# Patient Record
Sex: Female | Born: 1985 | Race: Black or African American | Hispanic: No | Marital: Single | Smoking: Former smoker
Health system: Southern US, Community
[De-identification: ages and names within clinical notes are randomized; demographics above are authoritative.]

## PROBLEM LIST (undated history)

## (undated) DIAGNOSIS — Z789 Other specified health status: Secondary | ICD-10-CM

## (undated) DIAGNOSIS — Z3490 Encounter for supervision of normal pregnancy, unspecified, unspecified trimester: Secondary | ICD-10-CM

## (undated) HISTORY — PX: OTHER SURGICAL HISTORY: SHX169

## (undated) HISTORY — PX: DILATION AND CURETTAGE OF UTERUS: SHX78

---

## 2006-01-09 ENCOUNTER — Emergency Department (HOSPITAL_COMMUNITY): Admission: EM | Admit: 2006-01-09 | Discharge: 2006-01-09 | Payer: Self-pay | Admitting: Family Medicine

## 2007-04-14 ENCOUNTER — Emergency Department (HOSPITAL_COMMUNITY): Admission: EM | Admit: 2007-04-14 | Discharge: 2007-04-14 | Payer: Self-pay | Admitting: Family Medicine

## 2008-02-15 ENCOUNTER — Inpatient Hospital Stay (HOSPITAL_COMMUNITY): Admission: RE | Admit: 2008-02-15 | Discharge: 2008-02-17 | Payer: Self-pay | Admitting: Obstetrics and Gynecology

## 2009-06-02 ENCOUNTER — Emergency Department (HOSPITAL_COMMUNITY): Admission: EM | Admit: 2009-06-02 | Discharge: 2009-06-02 | Payer: Self-pay | Admitting: Emergency Medicine

## 2010-01-27 ENCOUNTER — Emergency Department (HOSPITAL_COMMUNITY): Admission: EM | Admit: 2010-01-27 | Discharge: 2010-01-27 | Payer: Self-pay | Admitting: Emergency Medicine

## 2010-03-28 ENCOUNTER — Emergency Department (HOSPITAL_COMMUNITY): Admission: EM | Admit: 2010-03-28 | Discharge: 2010-03-28 | Payer: Self-pay | Admitting: Emergency Medicine

## 2010-04-01 ENCOUNTER — Emergency Department (HOSPITAL_COMMUNITY): Admission: EM | Admit: 2010-04-01 | Discharge: 2010-04-01 | Payer: Self-pay | Admitting: Family Medicine

## 2010-12-30 ENCOUNTER — Inpatient Hospital Stay (INDEPENDENT_AMBULATORY_CARE_PROVIDER_SITE_OTHER)
Admission: RE | Admit: 2010-12-30 | Discharge: 2010-12-30 | Disposition: A | Discharge status: Home / Self Care | Payer: Self-pay | Source: Ambulatory Visit | Attending: Family Medicine | Admitting: Family Medicine

## 2010-12-30 DIAGNOSIS — L0501 Pilonidal cyst with abscess: Secondary | ICD-10-CM

## 2011-01-03 LAB — CULTURE, ROUTINE-ABSCESS: Gram Stain: NONE SEEN

## 2011-01-20 LAB — POCT URINALYSIS DIP (DEVICE)
Bilirubin Urine: NEGATIVE
Glucose, UA: NEGATIVE mg/dL
Hgb urine dipstick: NEGATIVE
Ketones, ur: NEGATIVE mg/dL
Nitrite: POSITIVE — AB
Protein, ur: NEGATIVE mg/dL
Specific Gravity, Urine: 1.025 (ref 1.005–1.030)
Urobilinogen, UA: 1 mg/dL (ref 0.0–1.0)
pH: 5.5 (ref 5.0–8.0)

## 2011-01-20 LAB — CULTURE, ROUTINE-ABSCESS

## 2011-01-20 LAB — GC/CHLAMYDIA PROBE AMP, GENITAL
Chlamydia, DNA Probe: NEGATIVE
GC Probe Amp, Genital: NEGATIVE

## 2011-01-20 LAB — WET PREP, GENITAL
Trich, Wet Prep: NONE SEEN
Yeast Wet Prep HPF POC: NONE SEEN

## 2011-01-20 LAB — POCT PREGNANCY, URINE: Preg Test, Ur: NEGATIVE

## 2011-01-20 LAB — URINE CULTURE: Colony Count: >=100000

## 2011-02-09 LAB — CULTURE, ROUTINE-ABSCESS

## 2011-03-18 NOTE — Discharge Summary (Signed)
NAME:  Anna [name], Anna [name]             ACCOUNT NO.:  [id number]   MEDICAL RECORD NO.:  [id number]          PATIENT TYPE:  INP   LOCATION:  9145                          FACILITY:  [facility]   PHYSICIAN:  Zenaida Niece, M.D.DATE OF BIRTH:  October 17, 1986   DATE OF ADMISSION:  02/15/2008  DATE OF DISCHARGE:  02/17/2008                               DISCHARGE SUMMARY   ADMISSION DIAGNOSIS:  Intrauterine intrauterine pregnancy at 40 weeks.   DISCHARGE DIAGNOSIS:  Intrauterine intrauterine pregnancy at 40 weeks.   PROCEDURES:  On 02/15/2008, she had a spontaneous vaginal delivery.   HISTORY AND PHYSICAL:  This is a 25 year old black female gravida 4,  para 0-0-3-0 with an EGA of 40 plus weeks who presents for elective  induction.  Prenatal care complicated by recurrent UTIs treated several  times with last culture on February 01, 2008, being negative.  She also  tested positive for Trichomonas and was treated with Flagyl.   PRENATAL LABS:  Blood type is A positive with a negative antibody  screen.  Hemoglobin electrophoresis is normal.  RPR nonreactive, rubella  immune, hepatitis B surface antigen negative, HIV negative, gonorrhea  and Chlamydia negative, group B strep is negative, 1-hour Glucola 128,  and first trimester screen is normal.   PAST OB HISTORY:  Three elective terminations.   GYN HISTORY:  History of Trichomonas.  Remainder of her history is  noncontributory.   PHYSICAL EXAM:  She is afebrile with stable vital signs.  Fetal heart  tracing reactive.  Abdomen is gravid, nontender.  Cervix is 2 plus, 75,  minus 2, and membranes were ruptured revealing clear fluid.   HOSPITAL COURSE:  The patient was admitted by Dr. Senaida Ores and put on  Pitocin.  She then examined her and ruptured membranes.  She became  uncomfortable and received an epidural.  She progressed to complete,  pushed well, and on the evening of February 15, 2008, had a vaginal  delivery of a viable female infant with  Apgars of 8 at nine that weighed  7 pounds 4 ounces.  Placenta delivered spontaneous.  Perineum was intact  and estimated blood loss was 400 mL.  Postpartum, she had no significant  complications.  Pre-delivery hemoglobin was 10.9 and it was not checked  after delivery.  On postpartum day #2,  she was felt to be stable enough  for discharge to home.   DISCHARGE INSTRUCTIONS:  Regular diet, pelvic rest.  Follow up with [place]  in 6 weeks.   MEDICATIONS:  Over-the-counter ibuprofen as needed and she is given our  discharge pamphlet.     Zenaida Niece, M.D.  Electronically Signed    TDM/MEDQ  D:  02/17/2008  T:  02/17/2008  Job:  [id number]

## 2011-05-19 ENCOUNTER — Inpatient Hospital Stay (HOSPITAL_COMMUNITY)
Admission: AD | Admit: 2011-05-19 | Discharge: 2011-05-20 | Disposition: A | Discharge status: Home / Self Care | Payer: Self-pay | Source: Ambulatory Visit | Attending: Obstetrics and Gynecology | Admitting: Obstetrics and Gynecology

## 2011-05-19 DIAGNOSIS — R109 Unspecified abdominal pain: Secondary | ICD-10-CM | POA: Insufficient documentation

## 2011-05-19 DIAGNOSIS — N926 Irregular menstruation, unspecified: Secondary | ICD-10-CM

## 2011-05-19 HISTORY — DX: Other specified health status: Z78.9

## 2011-05-19 LAB — POCT PREGNANCY, URINE: Preg Test, Ur: NEGATIVE

## 2011-05-19 NOTE — Progress Notes (Signed)
Pt reports LMP was 06/02 and she "already knew I was pregnant". States she has not had a preg test. Tonight started having cramping and bleeding. Bleeding now is just on the tissue when she wipes.

## 2011-05-20 ENCOUNTER — Encounter (HOSPITAL_COMMUNITY): Payer: Self-pay | Admitting: *Deleted

## 2011-05-20 LAB — URINALYSIS, ROUTINE W REFLEX MICROSCOPIC
Bilirubin Urine: NEGATIVE
Glucose, UA: NEGATIVE mg/dL
Ketones, ur: NEGATIVE mg/dL
Leukocytes, UA: NEGATIVE
Nitrite: NEGATIVE
Protein, ur: NEGATIVE mg/dL
Specific Gravity, Urine: >1.03 — ABNORMAL HIGH (ref 1.005–1.030)
Urobilinogen, UA: 0.2 mg/dL (ref 0.0–1.0)
pH: 5.5 (ref 5.0–8.0)

## 2011-05-20 LAB — URINE MICROSCOPIC-ADD ON

## 2011-05-20 NOTE — ED Provider Notes (Signed)
History     Chief Complaint  Patient presents with  . Abdominal Pain   HPI  Patient is here with report of vaginal bleeding that began at 2200 last night. Last menstrual period was on 04/05/2011. Patient reports having unprotected intercourse, concerned about a possible pregnancy. Also states that she has positive abdominal cramping in the mid pelvic region. Denies unilateral pelvic pain. Here to see if she is pregnant. Patient reports that her bleeding is typical of her menstrual cycle.   Past Medical History  Diagnosis Date  . No pertinent past medical history     Past Surgical History  Procedure Date  . No past surgeries     No family history on file.  History  Substance Use Topics  . Smoking status: Current Everyday Smoker -- 0.2 packs/day  . Smokeless tobacco: Not on file  . Alcohol Use: No    Allergies: No Known Allergies  Prescriptions prior to admission  Medication Sig Dispense Refill  . OVER THE COUNTER MEDICATION Take 1 tablet by mouth once. Detox medication purchased at [employer].         Review of Systems  Gastrointestinal: Positive for abdominal pain (cramping).  All other systems reviewed and are negative.   Physical Exam   Blood pressure 123/87, pulse 74, temperature 98.7 F (37.1 C), temperature source Oral, resp. rate 18, height 5\' 3"  (1.6 m), weight 83.008 kg (183 lb), last menstrual period 04/05/2011.  Physical Exam  Constitutional: She is oriented to person, place, and time. She appears well-developed and well-nourished.  HENT:  Head: Normocephalic.  Neck: Normal range of motion. Neck supple.  Cardiovascular: Normal rate, regular rhythm and normal heart sounds.   Respiratory: Effort normal and breath sounds normal.  GI: Soft. She exhibits no mass. There is tenderness. There is no guarding.  Genitourinary: There is bleeding (negative clots) around the vagina.  Neurological: She is alert and oriented to person, place, and time. She has  normal reflexes.  Skin: Skin is warm and dry.    MAU Course  Procedures Pregnancy test negative   Assessment and Plan  Menstrual cycle  Take over-the-counter pain medication as needed.  [facility] 05/20/2011, 1:23 AM

## 2011-07-29 LAB — CBC
HCT: 31.1 — ABNORMAL LOW
Hemoglobin: 10.9 — ABNORMAL LOW
MCHC: 34.9
MCV: 86.2
Platelets: 187
RBC: 3.6 — ABNORMAL LOW
RDW: 15.8 — ABNORMAL HIGH
WBC: 8.7

## 2011-07-29 LAB — RPR: RPR Ser Ql: NONREACTIVE

## 2011-11-04 NOTE — L&D Delivery Note (Signed)
Delivery Note Pt reached complete dilation and pushed about 5 minutes.  At 2:48 PM a viable female was delivered via Vaginal, Spontaneous Delivery (Presentation: LOA ).  APGAR:8 ,9 ; weight pending.   Placenta status: delivered spontaneously , .  Cord:  with the following complications:none .    Anesthesia: Epidural  Episiotomy: n/a  Lacerations: n/a Suture Repair:n/a Est. Blood Loss (mL): 350cc  Mom to postpartum.  Baby to stay with mother.  Anna [name] 10/06/2012, 3:02 PM

## 2012-03-28 ENCOUNTER — Encounter (HOSPITAL_COMMUNITY): Payer: Self-pay | Admitting: *Deleted

## 2012-03-28 ENCOUNTER — Emergency Department (HOSPITAL_COMMUNITY)
Admission: EM | Admit: 2012-03-28 | Discharge: 2012-03-29 | Disposition: A | Discharge status: Home / Self Care | Payer: Medicaid Other | Attending: Emergency Medicine | Admitting: Emergency Medicine

## 2012-03-28 DIAGNOSIS — B9689 Other specified bacterial agents as the cause of diseases classified elsewhere: Secondary | ICD-10-CM | POA: Insufficient documentation

## 2012-03-28 DIAGNOSIS — O26891 Other specified pregnancy related conditions, first trimester: Secondary | ICD-10-CM

## 2012-03-28 DIAGNOSIS — R109 Unspecified abdominal pain: Secondary | ICD-10-CM | POA: Insufficient documentation

## 2012-03-28 DIAGNOSIS — N760 Acute vaginitis: Secondary | ICD-10-CM | POA: Insufficient documentation

## 2012-03-28 DIAGNOSIS — N949 Unspecified condition associated with female genital organs and menstrual cycle: Secondary | ICD-10-CM | POA: Insufficient documentation

## 2012-03-28 DIAGNOSIS — O2390 Unspecified genitourinary tract infection in pregnancy, unspecified trimester: Secondary | ICD-10-CM | POA: Insufficient documentation

## 2012-03-28 DIAGNOSIS — A499 Bacterial infection, unspecified: Secondary | ICD-10-CM | POA: Insufficient documentation

## 2012-03-28 LAB — URINALYSIS, ROUTINE W REFLEX MICROSCOPIC
Bilirubin Urine: NEGATIVE
Glucose, UA: NEGATIVE mg/dL
Hgb urine dipstick: NEGATIVE
Ketones, ur: 15 mg/dL — AB
Leukocytes, UA: NEGATIVE
Nitrite: NEGATIVE
Protein, ur: NEGATIVE mg/dL
Specific Gravity, Urine: 1.009 (ref 1.005–1.030)
Urobilinogen, UA: 0.2 mg/dL (ref 0.0–1.0)
pH: 7 (ref 5.0–8.0)

## 2012-03-28 LAB — POCT PREGNANCY, URINE
Preg Test, Ur: POSITIVE — AB
Preg Test, Ur: POSITIVE — AB

## 2012-03-28 LAB — PREGNANCY, URINE: Preg Test, Ur: POSITIVE — AB

## 2012-03-28 NOTE — ED Provider Notes (Signed)
History     CSN: [id number]  Arrival date & time 03/28/12  2209   None     Chief Complaint  Patient presents with  . Abdominal Pain    (Consider location/radiation/quality/duration/timing/severity/associated sxs/prior treatment) Patient is a 26 y.o. female presenting with abdominal pain. The history is provided by the patient.  Abdominal Pain The primary symptoms of the illness include abdominal pain.  She has had suprapubic cramping for the last week. There's been no vaginal bleeding that she has had a yellowish vaginal discharge. She's had nausea but no vomiting. She denies fever, chills, sweats. She is pregnant with last menstrual period March 5 which would make her about [redacted] weeks gestation at this point. She is gravida 5, para 1, with 3 voluntary interruptions of pregnancy. She is taking acetaminophen which didn't give temporary relief of pain. Pain is moderate and she rates it at 6/10. Nothing makes it better nothing makes it worse. She has not started prenatal care yet.  Past Medical History  Diagnosis Date  . No pertinent past medical history     Past Surgical History  Procedure Date  . No past surgeries     No family history on file.  History  Substance Use Topics  . Smoking status: Current Everyday Smoker -- 0.2 packs/day  . Smokeless tobacco: Not on file  . Alcohol Use: No    OB History    Grav Para Term Preterm Abortions TAB SAB Ect Mult Living   4 1 1  3 3    1       Review of Systems  Gastrointestinal: Positive for abdominal pain.  All other systems reviewed and are negative.    Allergies  Review of patient's allergies indicates no known allergies.  Home Medications   Current Outpatient Rx  Name Route Sig Dispense Refill  . ACETAMINOPHEN 500 MG PO TABS Oral Take 1,000 mg by mouth every 6 (six) hours as needed. For back pain and cramps    . PRENATAL 27-0.8 MG PO TABS Oral Take 1 tablet by mouth daily.      BP 121/68  Pulse 68  Temp(Src) 98.5  F (36.9 C) (Oral)  Resp 18  SpO2 100%  LMP 01/27/2012  Physical Exam  Nursing note and vitals reviewed.  26 year old female resting currently in no acute distress. Vital signs are normal. Oxygen saturation is 100% which is normal. Head is normal cephalic and atraumatic. PERRLA, EOMI. Oropharynx is clear. Neck is nontender and supple. Back is nontender there's no CVA tenderness. Lungs are clear without rales, wheezes, rhonchi. Heart has regular rate rhythm without murmur. Abdomen is soft, flat, with mild suprapubic tenderness, but without masses or hepatosplenomegaly. Pelvic exam: Normal external female genitalia, moderate amount of white vaginal discharge present, cervix appears normal and is closed, fundus is 10-12 weeks size consistent with dates and nontender, there is moderate right adnexal tenderness without any mass palpable, there is mild left adnexal tenderness without any mass palpable. Extremities have no cyanosis or edema, full range of motion is present. Skin is warm and dry without rash. Neurologic: Mental status is normal, cranial nerves are intact, there no focal motor or sensory deficits.   ED Course  Procedures (including critical care time)  Results for orders placed during the hospital encounter of 03/28/12  POCT PREGNANCY, URINE      Component Value Range   Preg Test, Ur POSITIVE (*) NEGATIVE   URINALYSIS, ROUTINE W REFLEX MICROSCOPIC      Component Value Range  Color, Urine YELLOW  YELLOW    APPearance CLEAR  CLEAR    Specific Gravity, Urine 1.009  1.005 - 1.030    pH 7.0  5.0 - 8.0    Glucose, UA NEGATIVE  NEGATIVE (mg/dL)   Hgb urine dipstick NEGATIVE  NEGATIVE    Bilirubin Urine NEGATIVE  NEGATIVE    Ketones, ur 15 (*) NEGATIVE (mg/dL)   Protein, ur NEGATIVE  NEGATIVE (mg/dL)   Urobilinogen, UA 0.2  0.0 - 1.0 (mg/dL)   Nitrite NEGATIVE  NEGATIVE    Leukocytes, UA NEGATIVE  NEGATIVE   PREGNANCY, URINE      Component Value Range   Preg Test, Ur POSITIVE (*)  NEGATIVE   POCT PREGNANCY, URINE      Component Value Range   Preg Test, Ur POSITIVE (*) NEGATIVE   CBC      Component Value Range   WBC 8.5  4.0 - 10.5 (K/uL)   RBC 3.69 (*) 3.87 - 5.11 (MIL/uL)   Hemoglobin 10.6 (*) 12.0 - 15.0 (g/dL)   HCT 16.1 (*) 09.6 - 46.0 (%)   MCV 84.0  78.0 - 100.0 (fL)   MCH 28.7  26.0 - 34.0 (pg)   MCHC 34.2  30.0 - 36.0 (g/dL)   RDW 04.5 (*) 40.9 - 15.5 (%)   Platelets 175  150 - 400 (K/uL)  DIFFERENTIAL      Component Value Range   Neutrophils Relative 65  43 - 77 (%)   Neutro Abs 5.5  1.7 - 7.7 (K/uL)   Lymphocytes Relative 29  12 - 46 (%)   Lymphs Abs 2.4  0.7 - 4.0 (K/uL)   Monocytes Relative 6  3 - 12 (%)   Monocytes Absolute 0.5  0.1 - 1.0 (K/uL)   Eosinophils Relative 1  0 - 5 (%)   Eosinophils Absolute 0.1  0.0 - 0.7 (K/uL)   Basophils Relative 0  0 - 1 (%)   Basophils Absolute 0.0  0.0 - 0.1 (K/uL)  BASIC METABOLIC PANEL      Component Value Range   Sodium 135  135 - 145 (mEq/L)   Potassium 3.1 (*) 3.5 - 5.1 (mEq/L)   Chloride 100  96 - 112 (mEq/L)   CO2 22  19 - 32 (mEq/L)   Glucose, Bld 86  70 - 99 (mg/dL)   BUN 8  6 - 23 (mg/dL)   Creatinine, Ser 8.11  0.50 - 1.10 (mg/dL)   Calcium 8.6  8.4 - 91.4 (mg/dL)   GFR calc non Af Amer >90  >90 (mL/min)   GFR calc Af Amer >90  >90 (mL/min)  WET PREP, GENITAL      Component Value Range   Yeast Wet Prep HPF POC NONE SEEN  NONE SEEN    Trich, Wet Prep NONE SEEN  NONE SEEN    Clue Cells Wet Prep HPF POC MODERATE (*) NONE SEEN    WBC, Wet Prep HPF POC FEW (*) NONE SEEN   HCG, QUANTITATIVE, PREGNANCY      Component Value Range   hCG, Beta Chain, Quant, S 103590 (*) <5 (mIU/mL)   US Ob Comp Less 14 Wks  03/29/2012  *RADIOLOGY REPORT*  Clinical Data: Abdominal pain.  Positive urine pregnancy test. Quantitative beta HCG is 103,590.  Estimated gestational age by LMP is 8 weeks 6 days.  OBSTETRIC <14 WK [place] AND TRANSVAGINAL OB US  Technique:  Both transabdominal and transvaginal ultrasound  examinations were performed for complete evaluation of the gestation as  well as the maternal uterus, adnexal regions, and pelvic cul-de-sac.  Transvaginal technique was performed to assess early pregnancy.  Comparison:  None.  Intrauterine gestational sac:  Single intrauterine pregnancy is demonstrated.  Prominent posterior fundal myometrium likely represents contraction.  No significant subchorionic hemorrhage. Yolk sac: Present Embryo: Present Cardiac Activity: Demonstrated Heart Rate:  bpm  CRL: 49.7   mm  11   w  5   d             [place] EDC: 10/13/2012  Maternal uterus/adnexae: Cervix appears closed.  Small Nabothian cyst.  Right ovary measures 2.1 x 2.8 x 1.8 cm.  No abnormal adnexal masses.  Left ovary measures 2 x 3.2 x 1.9 cm.  No abnormal adnexal masses. No free pelvic fluid collections.  IMPRESSION: Single intrauterine pregnancy.  Estimated gestational age by crown- rump length is 11 weeks 5 days.  No abnormal adnexal masses or free pelvic fluid collections.  Original Report Authenticated By: Marlon Pel, M.D.   US Ob Transvaginal  03/29/2012  *RADIOLOGY REPORT*  Clinical Data: Abdominal pain.  Positive urine pregnancy test. Quantitative beta HCG is 103,590.  Estimated gestational age by LMP is 8 weeks 6 days.  OBSTETRIC <14 WK [place] AND TRANSVAGINAL OB US  Technique:  Both transabdominal and transvaginal ultrasound examinations were performed for complete evaluation of the gestation as well as the maternal uterus, adnexal regions, and pelvic cul-de-sac.  Transvaginal technique was performed to assess early pregnancy.  Comparison:  None.  Intrauterine gestational sac:  Single intrauterine pregnancy is demonstrated.  Prominent posterior fundal myometrium likely represents contraction.  No significant subchorionic hemorrhage. Yolk sac: Present Embryo: Present Cardiac Activity: Demonstrated Heart Rate:  bpm  CRL: 49.7   mm  11   w  5   d             [place] EDC: 10/13/2012  Maternal uterus/adnexae: Cervix  appears closed.  Small Nabothian cyst.  Right ovary measures 2.1 x 2.8 x 1.8 cm.  No abnormal adnexal masses.  Left ovary measures 2 x 3.2 x 1.9 cm.  No abnormal adnexal masses. No free pelvic fluid collections.  IMPRESSION: Single intrauterine pregnancy.  Estimated gestational age by crown- rump length is 11 weeks 5 days.  No abnormal adnexal masses or free pelvic fluid collections.  Original Report Authenticated By: Marlon Pel, M.D.      1. Pelvic pain complicating pregnancy, antepartum, first trimester   2. Bacterial vaginosis       MDM  First trimester pregnancy with suprapubic pain and right adnexal tenderness. The ultrasound will be obtained to rule out ectopic pregnancy.  Ultrasound shows no evidence of ectopic pregnancy. Gestational size is consistent with her date of last menstrual period. No evidence of ovarian cyst. Pain may conceivably be due 2 round ligament pain although it is not usually seen this early in pregnancy. She'll be treated symptomatically. Prescription is given for Percocet and she is to make an appointment with her obstetrician for prenatal intake.        Dione Booze, MD 03/29/12 [phone]

## 2012-03-28 NOTE — ED Notes (Signed)
Urine recollected to ensure correct patient's urine (two urine samples in triage were labeled with patient's urine and sent to lab with different requisition slips).  Recollected urine from patient and placed orders in system again, per laboratory recommendation.

## 2012-03-28 NOTE — ED Notes (Signed)
C.o abd cramping for one week no bleeding.  lmp  March 5th.   Shes pregnant

## 2012-03-28 NOTE — ED Notes (Addendum)
Patient complaining of lower back pain and lower abdominal cramping for the past week; patient states that pain gradually became worse today.  Patient rates pain 7/10 on the numerical pain scale; describes lower back pain as "aching" and abdominal pain as "cramping".  Patient states that she has been taking extra strength Tylenol at home (1000 mg).  Last bowel movement today; patient reports constipation before today (2-3 days since last bowel movement).  Patient does report urinary frequency.  Denies dysuria and hematuria.  Last menstrual period March 5th.  Patient alert and oriented x4; PERRL present.  Upon arrival to room, patient changed into gown.  Family present at bedside.  Will continue to monitor.

## 2012-03-29 ENCOUNTER — Emergency Department (HOSPITAL_COMMUNITY): Payer: Medicaid Other

## 2012-03-29 LAB — WET PREP, GENITAL
Trich, Wet Prep: NONE SEEN
Yeast Wet Prep HPF POC: NONE SEEN

## 2012-03-29 LAB — HIV ANTIBODY (ROUTINE TESTING W REFLEX): HIV: NONREACTIVE

## 2012-03-29 LAB — DIFFERENTIAL
Basophils Absolute: 0 10*3/uL (ref 0.0–0.1)
Basophils Relative: 0 % (ref 0–1)
Eosinophils Absolute: 0.1 10*3/uL (ref 0.0–0.7)
Eosinophils Relative: 1 % (ref 0–5)
Lymphocytes Relative: 29 % (ref 12–46)
Lymphs Abs: 2.4 10*3/uL (ref 0.7–4.0)
Monocytes Absolute: 0.5 10*3/uL (ref 0.1–1.0)
Monocytes Relative: 6 % (ref 3–12)
Neutro Abs: 5.5 10*3/uL (ref 1.7–7.7)
Neutrophils Relative %: 65 % (ref 43–77)

## 2012-03-29 LAB — BASIC METABOLIC PANEL
BUN: 8 mg/dL (ref 6–23)
CO2: 22 mEq/L (ref 19–32)
Calcium: 8.6 mg/dL (ref 8.4–10.5)
Chloride: 100 mEq/L (ref 96–112)
Creatinine, Ser: 0.66 mg/dL (ref 0.50–1.10)
GFR calc Af Amer: >90 mL/min (ref >90)
GFR calc non Af Amer: >90 mL/min (ref >90)
Glucose, Bld: 86 mg/dL (ref 70–99)
Potassium: 3.1 mEq/L — ABNORMAL LOW (ref 3.5–5.1)
Sodium: 135 mEq/L (ref 135–145)

## 2012-03-29 LAB — CBC
HCT: 31 % — ABNORMAL LOW (ref 36.0–46.0)
Hemoglobin: 10.6 g/dL — ABNORMAL LOW (ref 12.0–15.0)
MCH: 28.7 pg (ref 26.0–34.0)
MCHC: 34.2 g/dL (ref 30.0–36.0)
MCV: 84 fL (ref 78.0–100.0)
Platelets: 175 10*3/uL (ref 150–400)
RBC: 3.69 MIL/uL — ABNORMAL LOW (ref 3.87–5.11)
RDW: 15.6 % — ABNORMAL HIGH (ref 11.5–15.5)
WBC: 8.5 10*3/uL (ref 4.0–10.5)

## 2012-03-29 LAB — RPR: RPR Ser Ql: NONREACTIVE

## 2012-03-29 LAB — HCG, QUANTITATIVE, PREGNANCY: hCG, Beta Chain, Quant, S: 103590 m[IU]/mL — ABNORMAL HIGH (ref <5)

## 2012-03-29 MED ORDER — OXYCODONE-ACETAMINOPHEN 5-325 MG PO TABS
1.0000 | ORAL_TABLET | Freq: Once | ORAL | Status: AC
  Administered 2012-03-29: 1 via ORAL
  Filled 2012-03-29: qty 1

## 2012-03-29 MED ORDER — POTASSIUM CHLORIDE CRYS ER 20 MEQ PO TBCR: 40.0000 meq | EXTENDED_RELEASE_TABLET | Freq: Once | ORAL | Status: DC

## 2012-03-29 MED ORDER — OXYCODONE-ACETAMINOPHEN 5-325 MG PO TABS: 1.0000 | ORAL_TABLET | ORAL | Status: AC | PRN

## 2012-03-29 NOTE — ED Notes (Signed)
Patient currently resting quietly in bed; no respiratory or acute distress noted.  Patient updated on plan of care; informed patient that we are currently waiting on discharge paperwork from EDP. Patient has no other questions or concerns at this time; will continue to monitor. 

## 2012-03-29 NOTE — Discharge Instructions (Signed)
Your ultrasound showed the baby is developing appropriately and is in the right position. There is no sign of any ovarian cysts. The pain may be due to stretching of ligaments. At this point, you should take acetaminophen for pain, but she can take Percocet for more severe pain. Please make an appointment with your obstetrician to start prenatal care. Return to the emergency department if symptoms are getting worse.  Acetaminophen; Oxycodone tablets What is this medicine? ACETAMINOPHEN; OXYCODONE (a set a MEE noe fen; ox i KOE done) is a pain reliever. It is used to treat mild to moderate pain. This medicine may be used for other purposes; ask your health care provider or pharmacist if you have questions. What should I tell my health care provider before I take this medicine? They need to know if you have any of these conditions: -brain tumor -Crohn's disease, inflammatory bowel disease, or ulcerative colitis -drink more than 3 alcohol containing drinks per day -drug abuse or addiction -head injury -heart or circulation problems -kidney disease or problems going to the bathroom -liver disease -lung disease, asthma, or breathing problems -an unusual or allergic reaction to acetaminophen, oxycodone, other opioid analgesics, other medicines, foods, dyes, or preservatives -pregnant or trying to get pregnant -breast-feeding How should I use this medicine? Take this medicine by mouth with a full glass of water. Follow the directions on the prescription label. Take your medicine at regular intervals. Do not take your medicine more often than directed. Talk to your pediatrician regarding the use of this medicine in children. Special care may be needed. Patients over 48 years old may have a stronger reaction and need a smaller dose. Overdosage: If you think you have taken too much of this medicine contact a poison control center or emergency room at once. NOTE: This medicine is only for you. Do not  share this medicine with others. What if I miss a dose? If you miss a dose, take it as soon as you can. If it is almost time for your next dose, take only that dose. Do not take double or extra doses. What may interact with this medicine? -alcohol or medicines that contain alcohol -antihistamines -barbiturates like amobarbital, butalbital, butabarbital, methohexital, pentobarbital, phenobarbital, thiopental, and secobarbital -benztropine -drugs for bladder problems like solifenacin, trospium, oxybutynin, tolterodine, hyoscyamine, and methscopolamine -drugs for breathing problems like ipratropium and tiotropium -drugs for certain stomach or intestine problems like propantheline, homatropine methylbromide, glycopyrrolate, atropine, belladonna, and dicyclomine -general anesthetics like etomidate, ketamine, nitrous oxide, propofol, desflurane, enflurane, halothane, isoflurane, and sevoflurane -medicines for depression, anxiety, or psychotic disturbances -medicines for pain like codeine, morphine, pentazocine, buprenorphine, butorphanol, nalbuphine, tramadol, and propoxyphene -medicines for sleep -muscle relaxants -naltrexone -phenothiazines like perphenazine, thioridazine, chlorpromazine, mesoridazine, fluphenazine, prochlorperazine, promazine, and trifluoperazine -scopolamine -trihexyphenidyl This list may not describe all possible interactions. Give your health care provider a list of all the medicines, herbs, non-prescription drugs, or dietary supplements you use. Also tell them if you smoke, drink alcohol, or use illegal drugs. Some items may interact with your medicine. What should I watch for while using this medicine? Tell your doctor or health care professional if your pain does not go away, if it gets worse, or if you have new or a different type of pain. You may develop tolerance to the medicine. Tolerance means that you will need a higher dose of the medication for pain relief. Tolerance  is normal and is expected if you take this medicine for a long time. Do not suddenly  stop taking your medicine because you may develop a severe reaction. Your body becomes used to the medicine. This does NOT mean you are addicted. Addiction is a behavior related to getting and using a drug for a nonmedical reason. If you have pain, you have a medical reason to take pain medicine. Your doctor will tell you how much medicine to take. If your doctor wants you to stop the medicine, the dose will be slowly lowered over time to avoid any side effects. You may get drowsy or dizzy. Do not drive, use machinery, or do anything that needs mental alertness until you know how this medicine affects you. Do not stand or sit up quickly, especially if you are an older patient. This reduces the risk of dizzy or fainting spells. Alcohol may interfere with the effect of this medicine. Avoid alcoholic drinks. The medicine will cause constipation. Try to have a bowel movement at least every 2 to 3 days. If you do not have a bowel movement for 3 days, call your doctor or health care professional. Do not take Tylenol (acetaminophen) or medicines that have acetaminophen with this medicine. Too much acetaminophen can be very dangerous. Many nonprescription medicines contain acetaminophen. Always read the labels carefully to avoid taking more acetaminophen. What side effects may I notice from receiving this medicine? Side effects that you should report to your doctor or health care professional as soon as possible: -allergic reactions like skin rash, itching or hives, swelling of the face, lips, or tongue -breathing difficulties, wheezing -confusion -light headedness or fainting spells -severe stomach pain -yellowing of the skin or the whites of the eyes Side effects that usually do not require medical attention (report to your doctor or health care professional if they continue or are  bothersome): -dizziness -drowsiness -nausea -vomiting This list may not describe all possible side effects. Call your doctor for medical advice about side effects. You may report side effects to FDA at 1-800-FDA-1088. Where should I keep my medicine? Keep out of the reach of children. This medicine can be abused. Keep your medicine in a safe place to protect it from theft. Do not share this medicine with anyone. Selling or giving away this medicine is dangerous and against the law. Store at room temperature between 20 and 25 degrees C (68 and 77 degrees F). Keep container tightly closed. Protect from light. Flush any unused medicines down the toilet. Do not use the medicine after the expiration date. NOTE: This sheet is a summary. It may not cover all possible information. If you have questions about this medicine, talk to your doctor, pharmacist, or health care provider.  2012, [employer]. (09/18/2008 10:01:21 AM)

## 2012-03-29 NOTE — ED Notes (Signed)
Patient currently resting quietly in bed; no respiratory or acute distress noted.  Patient updated on plan of care; informed patient that we are currently waiting on further orders from EDP.  Patient has no other questions or concerns at this time; will continue to monitor. 

## 2012-03-29 NOTE — ED Notes (Signed)
Patient currently resting quietly in bed; no respiratory or acute distress noted.  Patient updated on plan of care; informed patient that we are currently waiting on lab results to come back.  Patient has no other questions or concerns at this time; will continue to monitor. 

## 2012-03-29 NOTE — ED Notes (Signed)
Lab at bedside

## 2012-03-29 NOTE — ED Notes (Signed)
Assisted EDP at bedside with pelvic exam.

## 2012-03-30 LAB — GC/CHLAMYDIA PROBE AMP, GENITAL
Chlamydia, DNA Probe: NEGATIVE
GC Probe Amp, Genital: NEGATIVE

## 2012-04-07 ENCOUNTER — Emergency Department (HOSPITAL_COMMUNITY)
Admission: EM | Admit: 2012-04-07 | Discharge: 2012-04-07 | Disposition: A | Discharge status: Home / Self Care | Payer: Medicaid Other | Source: Home / Self Care | Attending: Emergency Medicine | Admitting: Emergency Medicine

## 2012-04-07 ENCOUNTER — Encounter (HOSPITAL_COMMUNITY): Payer: Self-pay | Admitting: *Deleted

## 2012-04-07 DIAGNOSIS — H109 Unspecified conjunctivitis: Secondary | ICD-10-CM

## 2012-04-07 HISTORY — DX: Encounter for supervision of normal pregnancy, unspecified, unspecified trimester: Z34.90

## 2012-04-07 MED ORDER — TOBRAMYCIN 0.3 % OP SOLN: 1.0000 [drp] | Freq: Four times a day (QID) | OPHTHALMIC | Status: AC

## 2012-04-07 NOTE — ED Notes (Addendum)
Pt with redness/irritation left eye with thick drainage this am - pt [redacted] weeks pregnant took percocet at 4pm without relief -

## 2012-04-07 NOTE — ED Provider Notes (Signed)
History     CSN: [id number]  Arrival date & time 04/07/12  1702   First MD Initiated Contact with Patient 04/07/12 1707      Chief Complaint  Patient presents with  . Eye Drainage  . Eye Problem  . Headache    (Consider location/radiation/quality/duration/timing/severity/associated sxs/prior treatment) HPI Comments: Malaise eyes started itching about 2 days ago and progressively got red and tearing and no this morning woke up with abundant crusted color discharge. It is discomforting and is worse today. I have not sustained any injuries to my eyes, have no changes in vision my eye does itch.  Patient denies any headache, nausea, or fevers.  Patient is a 26 y.o. Anna [name] presenting with eye problem and headaches. The history is provided by the patient.  Eye Problem  This is a new problem. The current episode started 2 days ago. The problem occurs constantly. The problem has not changed since onset.There is pain in the left eye. There was no injury mechanism. Associated symptoms include discharge, eye redness and itching. Pertinent negatives include no numbness, no blurred vision, no decreased vision, no double vision, no foreign body sensation, no photophobia, no nausea and no vomiting.  Headache The primary symptoms include headaches. Primary symptoms do not include nausea or vomiting.  The headache is associated with eye pain. The headache is not associated with photophobia, double vision or decreased vision.  Additional symptoms do not include photophobia.    Past Medical History  Diagnosis Date  . No pertinent past medical history   . Pregnant     Past Surgical History  Procedure Date  . No past surgeries     History reviewed. No pertinent family history.  History  Substance Use Topics  . Smoking status: Current Everyday Smoker -- 0.2 packs/day  . Smokeless tobacco: Not on file  . Alcohol Use: No    OB History    Grav Para Term Preterm Abortions TAB SAB Ect Mult  Living   5 1 1  3 3    1       Review of Systems  Constitutional: Negative for activity change.  Eyes: Positive for pain, discharge, redness and itching. Negative for blurred vision, double vision, photophobia and visual disturbance.  Gastrointestinal: Negative for nausea and vomiting.  Skin: Positive for itching.  Neurological: Positive for headaches. Negative for numbness.    Allergies  Review of patient's allergies indicates no known allergies.  Home Medications   Current Outpatient Rx  Name Route Sig Dispense Refill  . ACETAMINOPHEN 500 MG PO TABS Oral Take 1,000 mg by mouth every 6 (six) hours as needed. For back pain and cramps    . METRONIDAZOLE 500 MG PO TABS Oral Take 500 mg by mouth 2 (two) times daily.    Marland Kitchen ONDANSETRON 4 MG PO TBDP Oral Take 4 mg by mouth every 8 (eight) hours as needed.    . OXYCODONE-ACETAMINOPHEN 5-325 MG PO TABS Oral Take 1 tablet by mouth every 4 (four) hours as needed for pain. 20 tablet 0  . PRENATAL 27-0.8 MG PO TABS Oral Take 1 tablet by mouth daily.    . TOBRAMYCIN SULFATE 0.3 % OP SOLN Left Eye Place 1 drop into the left eye every 6 (six) hours. 5 mL 0    BP 138/90  Pulse 91  Temp(Src) 98.4 F (36.9 C) (Oral)  Resp 16  SpO2 98%  LMP 01/06/2012  Physical Exam  Nursing note and vitals reviewed. Constitutional: She appears well-developed and well-nourished.  Eyes: EOM are normal. Pupils are equal, round, and reactive to light. No foreign bodies found. Right eye exhibits no discharge. Left eye exhibits discharge. Left eye exhibits no exudate and no hordeolum. No foreign body present in the left eye. Left conjunctiva is injected. Left conjunctiva has no hemorrhage. No scleral icterus.    ED Course  Procedures (including critical care time)  Labs Reviewed - No data to display No results found.   1. Conjunctivitis       MDM  Left conjunctivitis.        Anna Molly, MD 04/07/12 [id number]

## 2012-04-07 NOTE — Discharge Instructions (Signed)
Conjunctivitis Conjunctivitis is commonly called "pink eye." Conjunctivitis can be caused by bacterial or viral infection, allergies, or injuries. There is usually redness of the lining of the eye, itching, discomfort, and sometimes discharge. There may be deposits of matter along the eyelids. A viral infection usually causes a watery discharge, while a bacterial infection causes a yellowish, thick discharge. Pink eye is very contagious and spreads by direct contact. You may be given antibiotic eyedrops as part of your treatment. Before using your eye medicine, remove all drainage from the eye by washing gently with warm water and cotton balls. Continue to use the medication until you have awakened 2 mornings in a row without discharge from the eye. Do not rub your eye. This increases the irritation and helps spread infection. Use separate towels from other household members. Wash your hands with soap and water before and after touching your eyes. Use cold compresses to reduce pain and sunglasses to relieve irritation from light. Do not wear contact lenses or wear eye makeup until the infection is gone. SEEK MEDICAL CARE IF:   Your symptoms are not better after 3 days of treatment.   You have increased pain or trouble seeing.   The outer eyelids become very red or swollen.  Document Released: 11/27/2004 Document Revised: 10/09/2011 Document Reviewed: 10/20/2005 ExitCare Patient Information 2012 [employer]. 

## 2012-04-13 LAB — OB RESULTS CONSOLE ANTIBODY SCREEN: Antibody Screen: NEGATIVE

## 2012-04-13 LAB — OB RESULTS CONSOLE ABO/RH: RH Type: POSITIVE

## 2012-09-09 LAB — OB RESULTS CONSOLE GBS: GBS: NEGATIVE

## 2012-09-27 ENCOUNTER — Encounter (HOSPITAL_COMMUNITY): Payer: Self-pay | Admitting: *Deleted

## 2012-09-27 ENCOUNTER — Telehealth (HOSPITAL_COMMUNITY): Payer: Self-pay | Admitting: *Deleted

## 2012-09-27 NOTE — Telephone Encounter (Signed)
Preadmission screen  

## 2012-10-05 NOTE — H&P (Signed)
Anna [name] is a 26 y.o. female G6 P1041 at 39+ weeks (EDD 10/13/12 by 11 week [place]) presenting for IOL at term.  Prenatal care uneventful.  History OB History    Grav Para Term Preterm Abortions TAB SAB Ect Mult Living   6 1 1  4 3 1   1     2009 NSVD 7#4oz EAB x 3 MAB x 1  Past Medical History  Diagnosis Date  . No pertinent past medical history   . Pregnant    Past Surgical History  Procedure Date  . Dilation and curettage of uterus   . Abortions    Family History: family history includes COPD in her maternal aunt, maternal grandfather, and maternal grandmother; Cancer in her maternal grandfather and maternal grandmother; Diabetes in her cousin, maternal aunt, and maternal grandmother; and Kidney disease in her maternal aunt and maternal grandfather. Social History:  reports that she quit smoking about 4 months ago. She has never used smokeless tobacco. She reports that she uses illicit drugs (Marijuana). She reports that she does not drink alcohol.   Prenatal Transfer Tool  Maternal Diabetes: No Genetic Screening: Normal Maternal Ultrasounds/Referrals: Normal Fetal Ultrasounds or other Referrals:  None Maternal Substance Abuse:  No Significant Maternal Medications:  None Significant Maternal Lab Results:  None Other Comments:  None  ROS    Last menstrual period 01/06/2012. Maternal Exam:  Uterine Assessment: Contraction strength is mild.  Contraction frequency is rare.   Abdomen: Patient reports no abdominal tenderness. Fetal presentation: vertex  Introitus: Normal vulva. Normal vagina.  Pelvis: adequate for delivery.      Physical Exam  Constitutional: She is oriented to person, place, and time. She appears well-developed and well-nourished.  Cardiovascular: Normal rate and regular rhythm.   Respiratory: Effort normal and breath sounds normal.  GI: Soft. Bowel sounds are normal.  Genitourinary: Vagina normal and uterus normal.  Musculoskeletal: Normal range  of motion.  Neurological: She is alert and oriented to person, place, and time.  Psychiatric: She has a normal mood and affect. Her behavior is normal.    Prenatal labs: ABO, Rh: A/Positive/-- (06/11 0000) Antibody: Negative (06/11 0000) Rubella:  Immune RPR: NON REACTIVE (05/26 2357)  HBsAg:   NR HIV: NON REACTIVE (05/26 2357)  GBS: Negative (11/07 0000)  One hour GTT 98 Quad screen negative Hgb AA CF negative  Assessment/Plan: Pt for IOL at term, plan AROM and pitocin   Teesha Ohm W 10/05/2012, 5:33 PM

## 2012-10-06 ENCOUNTER — Encounter (HOSPITAL_COMMUNITY): Payer: Self-pay

## 2012-10-06 ENCOUNTER — Encounter (HOSPITAL_COMMUNITY): Payer: Self-pay | Admitting: Anesthesiology

## 2012-10-06 ENCOUNTER — Inpatient Hospital Stay (HOSPITAL_COMMUNITY): Payer: Medicaid Other | Admitting: Anesthesiology

## 2012-10-06 ENCOUNTER — Inpatient Hospital Stay (HOSPITAL_COMMUNITY)
Admission: RE | Admit: 2012-10-06 | Discharge: 2012-10-08 | DRG: 775 | Disposition: A | Discharge status: Home / Self Care | Payer: Medicaid Other | Source: Ambulatory Visit | Attending: Obstetrics and Gynecology | Admitting: Obstetrics and Gynecology

## 2012-10-06 LAB — CBC
HCT: 32.8 % — ABNORMAL LOW (ref 36.0–46.0)
Hemoglobin: 10.7 g/dL — ABNORMAL LOW (ref 12.0–15.0)
MCH: 29.5 pg (ref 26.0–34.0)
MCHC: 32.6 g/dL (ref 30.0–36.0)
MCV: 90.4 fL (ref 78.0–100.0)
Platelets: 199 10*3/uL (ref 150–400)
RBC: 3.63 MIL/uL — ABNORMAL LOW (ref 3.87–5.11)
RDW: 16.1 % — ABNORMAL HIGH (ref 11.5–15.5)
WBC: 8.8 10*3/uL (ref 4.0–10.5)

## 2012-10-06 LAB — RPR: RPR Ser Ql: NONREACTIVE

## 2012-10-06 MED ORDER — TERBUTALINE SULFATE 1 MG/ML IJ SOLN: 0.2500 mg | Freq: Once | INTRAMUSCULAR | Status: DC | PRN

## 2012-10-06 MED ORDER — LACTATED RINGERS IV SOLN: 500.0000 mL | Freq: Once | INTRAVENOUS | Status: DC

## 2012-10-06 MED ORDER — DIPHENHYDRAMINE HCL 50 MG/ML IJ SOLN: 12.5000 mg | INTRAMUSCULAR | Status: DC | PRN

## 2012-10-06 MED ORDER — ACETAMINOPHEN 325 MG PO TABS: 650.0000 mg | ORAL_TABLET | ORAL | Status: DC | PRN

## 2012-10-06 MED ORDER — CITRIC ACID-SODIUM CITRATE 334-500 MG/5ML PO SOLN: 30.0000 mL | ORAL | Status: DC | PRN

## 2012-10-06 MED ORDER — LIDOCAINE HCL (PF) 1 % IJ SOLN: 30.0000 mL | INTRAMUSCULAR | Status: DC | PRN

## 2012-10-06 MED ORDER — DIPHENHYDRAMINE HCL 25 MG PO CAPS: 25.0000 mg | ORAL_CAPSULE | Freq: Four times a day (QID) | ORAL | Status: DC | PRN

## 2012-10-06 MED ORDER — FENTANYL 2.5 MCG/ML BUPIVACAINE 1/10 % EPIDURAL INFUSION (WH - ANES)
14.0000 mL/h | INTRAMUSCULAR | Status: DC
  Administered 2012-10-06: 14 mL/h via EPIDURAL
  Filled 2012-10-06: qty 125

## 2012-10-06 MED ORDER — ONDANSETRON HCL 4 MG/2ML IJ SOLN: 4.0000 mg | Freq: Four times a day (QID) | INTRAMUSCULAR | Status: DC | PRN

## 2012-10-06 MED ORDER — BENZOCAINE-MENTHOL 20-0.5 % EX AERO: 1.0000 "application " | INHALATION_SPRAY | CUTANEOUS | Status: DC | PRN

## 2012-10-06 MED ORDER — OXYCODONE-ACETAMINOPHEN 5-325 MG PO TABS: 1.0000 | ORAL_TABLET | ORAL | Status: DC | PRN

## 2012-10-06 MED ORDER — PRENATAL MULTIVITAMIN CH
1.0000 | ORAL_TABLET | Freq: Every day | ORAL | Status: DC
  Administered 2012-10-06 – 2012-10-08 (×3): 1 via ORAL
  Filled 2012-10-06 (×3): qty 1

## 2012-10-06 MED ORDER — IBUPROFEN 600 MG PO TABS: 600.0000 mg | ORAL_TABLET | Freq: Four times a day (QID) | ORAL | Status: DC | PRN

## 2012-10-06 MED ORDER — OXYTOCIN 40 UNITS IN LACTATED RINGERS INFUSION - SIMPLE MED: 62.5000 mL/h | INTRAVENOUS | Status: DC

## 2012-10-06 MED ORDER — LACTATED RINGERS IV SOLN
INTRAVENOUS | Status: DC
  Administered 2012-10-06 (×2): via INTRAVENOUS

## 2012-10-06 MED ORDER — TETANUS-DIPHTH-ACELL PERTUSSIS 5-2.5-18.5 LF-MCG/0.5 IM SUSP: 0.5000 mL | Freq: Once | INTRAMUSCULAR | Status: DC

## 2012-10-06 MED ORDER — DIBUCAINE 1 % RE OINT: 1.0000 "application " | TOPICAL_OINTMENT | RECTAL | Status: DC | PRN

## 2012-10-06 MED ORDER — LACTATED RINGERS IV SOLN: 500.0000 mL | INTRAVENOUS | Status: DC | PRN

## 2012-10-06 MED ORDER — OXYTOCIN BOLUS FROM INFUSION: 500.0000 mL | INTRAVENOUS | Status: DC

## 2012-10-06 MED ORDER — IBUPROFEN 600 MG PO TABS
600.0000 mg | ORAL_TABLET | Freq: Four times a day (QID) | ORAL | Status: DC
  Administered 2012-10-06 – 2012-10-08 (×7): 600 mg via ORAL
  Filled 2012-10-06 (×8): qty 1

## 2012-10-06 MED ORDER — PHENYLEPHRINE 40 MCG/ML (10ML) SYRINGE FOR IV PUSH (FOR BLOOD PRESSURE SUPPORT): 80.0000 ug | PREFILLED_SYRINGE | INTRAVENOUS | Status: DC | PRN

## 2012-10-06 MED ORDER — SIMETHICONE 80 MG PO CHEW: 80.0000 mg | CHEWABLE_TABLET | ORAL | Status: DC | PRN

## 2012-10-06 MED ORDER — EPHEDRINE 5 MG/ML INJ: 10.0000 mg | INTRAVENOUS | Status: DC | PRN

## 2012-10-06 MED ORDER — OXYTOCIN 40 UNITS IN LACTATED RINGERS INFUSION - SIMPLE MED
1.0000 m[IU]/min | INTRAVENOUS | Status: DC
  Administered 2012-10-06: 2 m[IU]/min via INTRAVENOUS
  Filled 2012-10-06: qty 1000

## 2012-10-06 MED ORDER — LANOLIN HYDROUS EX OINT: TOPICAL_OINTMENT | CUTANEOUS | Status: DC | PRN

## 2012-10-06 MED ORDER — ONDANSETRON HCL 4 MG/2ML IJ SOLN: 4.0000 mg | INTRAMUSCULAR | Status: DC | PRN

## 2012-10-06 MED ORDER — WITCH HAZEL-GLYCERIN EX PADS: 1.0000 "application " | MEDICATED_PAD | CUTANEOUS | Status: DC | PRN

## 2012-10-06 MED ORDER — PHENYLEPHRINE 40 MCG/ML (10ML) SYRINGE FOR IV PUSH (FOR BLOOD PRESSURE SUPPORT)
80.0000 ug | PREFILLED_SYRINGE | INTRAVENOUS | Status: DC | PRN
  Filled 2012-10-06: qty 5

## 2012-10-06 MED ORDER — SENNOSIDES-DOCUSATE SODIUM 8.6-50 MG PO TABS
2.0000 | ORAL_TABLET | Freq: Every day | ORAL | Status: DC
  Administered 2012-10-06 – 2012-10-07 (×2): 2 via ORAL

## 2012-10-06 MED ORDER — SODIUM BICARBONATE 8.4 % IV SOLN
INTRAVENOUS | Status: DC | PRN
  Administered 2012-10-06: 5 mL via EPIDURAL

## 2012-10-06 MED ORDER — ZOLPIDEM TARTRATE 5 MG PO TABS: 5.0000 mg | ORAL_TABLET | Freq: Every evening | ORAL | Status: DC | PRN

## 2012-10-06 MED ORDER — ONDANSETRON HCL 4 MG PO TABS: 4.0000 mg | ORAL_TABLET | ORAL | Status: DC | PRN

## 2012-10-06 MED ORDER — EPHEDRINE 5 MG/ML INJ
10.0000 mg | INTRAVENOUS | Status: DC | PRN
  Filled 2012-10-06: qty 4

## 2012-10-06 NOTE — Progress Notes (Signed)
Patient ID: Anna [name], female   DOB: 1986/02/16, 26 y.o.   MRN: [id number] Pt with no significant contractions, pitocin not yet started FHR reactive Cervix 70/2-3/-2 AROM clear Plans epidural, pitocin is ordered and will be started

## 2012-10-06 NOTE — Anesthesia Preprocedure Evaluation (Signed)

## 2012-10-06 NOTE — Anesthesia Procedure Notes (Signed)

## 2012-10-07 LAB — CBC
HCT: 30.4 % — ABNORMAL LOW (ref 36.0–46.0)
Hemoglobin: 9.8 g/dL — ABNORMAL LOW (ref 12.0–15.0)
MCH: 29.3 pg (ref 26.0–34.0)
MCHC: 32.2 g/dL (ref 30.0–36.0)
MCV: 90.7 fL (ref 78.0–100.0)
Platelets: 172 10*3/uL (ref 150–400)
RBC: 3.35 MIL/uL — ABNORMAL LOW (ref 3.87–5.11)
RDW: 16.1 % — ABNORMAL HIGH (ref 11.5–15.5)
WBC: 11.8 10*3/uL — ABNORMAL HIGH (ref 4.0–10.5)

## 2012-10-07 MED ORDER — TETANUS-DIPHTH-ACELL PERTUSSIS 5-2.5-18.5 LF-MCG/0.5 IM SUSP
0.5000 mL | Freq: Once | INTRAMUSCULAR | Status: AC
  Administered 2012-10-07: 0.5 mL via INTRAMUSCULAR
  Filled 2012-10-07: qty 0.5

## 2012-10-07 MED ORDER — INFLUENZA VIRUS VACC SPLIT PF IM SUSP: 0.5000 mL | INTRAMUSCULAR | Status: DC

## 2012-10-07 MED ORDER — MEDROXYPROGESTERONE ACETATE 150 MG/ML IM SUSP
150.0000 mg | Freq: Once | INTRAMUSCULAR | Status: AC
  Administered 2012-10-08: 150 mg via INTRAMUSCULAR
  Filled 2012-10-07: qty 1

## 2012-10-07 NOTE — Progress Notes (Signed)
Post Partum Day 1 Subjective: no complaints and tolerating PO  Objective: Blood pressure 98/60, pulse 82, temperature 97.7 F (36.5 C), temperature source Oral, resp. rate 16, height 5\' 5"  (1.651 m), weight 89.359 kg (197 lb), last menstrual period 01/06/2012, SpO2 98.00%, unknown if currently breastfeeding.  Physical Exam:  General: alert and cooperative Lochia: appropriate Uterine Fundus: firm     Basename 10/07/12 0515 10/06/12 0800  HGB 9.8* 10.7*  HCT 30.4* 32.8*    Assessment/Plan: Plan for discharge tomorrow   LOS: 1 day   Niobe Dick W 10/07/2012, 8:30 AM

## 2012-10-07 NOTE — Anesthesia Postprocedure Evaluation (Signed)
  Anesthesia Post-op Note  Patient: Anna [name]  Procedure(s) Performed: * No procedures listed *  Patient Location: Mother/Baby  Anesthesia Type:Epidural  Level of Consciousness: awake  Airway and Oxygen Therapy: Patient Spontanous Breathing  Post-op Pain: mild  Post-op Assessment: Patient's Cardiovascular Status Stable and Respiratory Function Stable  Post-op Vital Signs: stable  Complications: No apparent anesthesia complications

## 2012-10-07 NOTE — Clinical Social Work Maternal (Signed)
    Clinical Social Work Department PSYCHOSOCIAL ASSESSMENT - MATERNAL/CHILD 10/07/2012  Patient:  Anna [name],Anna [name]  Account Number:  [id number]  Admit Date:  10/06/2012  Marjo Bicker Name:   Gladys Damme    Clinical Social Worker:  Nobie Putnam, LCSW   Date/Time:  10/07/2012 03:39 PM  Date Referred:  10/07/2012   Referral source  CN     Referred reason  Depression/Anxiety  Substance Abuse   Other referral source:    I:  FAMILY / HOME ENVIRONMENT Child's legal guardian:  PARENT  Guardian - Name Guardian - Age Guardian - Address  Anna [name] 26 [address] [place], [place] [address]  Anna [name] 24    Other household support members/support persons Name Relationship DOB  Anna [name] DAUGHTER 02/15/08   Other support:   Janice Coffin, mother  Charlena Cross, sister    II  PSYCHOSOCIAL DATA Information Source:  Patient Interview  Event organiser Employment:   Surveyor, quantity resources:  [employer] If Medicaid - County:  [place] Other  Sales executive  WIC  Section 8   School / Grade:   Maternity Care Coordinator / Child Services Coordination / Early Interventions:  Cultural issues impacting care:    III  STRENGTHS Strengths  Adequate Resources  Home prepared for Child (including basic supplies)  Supportive family/friends   Strength comment:    IV  RISK FACTORS AND CURRENT PROBLEMS Current Problem:  YES   Risk Factor & Current Problem Patient Issue Family Issue Risk Factor / Current Problem Comment  Substance Abuse Y N Hx of MJ use  Mental Illness Y N Hx of anxiety   N N    N N     V  SOCIAL WORK ASSESSMENT CSW met with pt to inquire about MJ use and history of anxiety.  Pt admits to smoking MJ "daily," prior to pregnancy confirmation at 2 months.  Once pregnancy was confirmed, she stopped smoking and denies other illegal substance use.  CSW explained hospital drug testing policy and pt verbalized understanding.  UDS is negative,  meconium results are pending.  Pt has a history with CPS but states her case was closed in 2010.  Pt acknowledges that she experiences anxious feelings often.  She normally talks with family or friends, as a coping mechanism.  Pt is not interested in mental health treatment at this time.  She denies any depression or SI history.  She reports having all the necessary supplies for the infant and good family support. Pt appears to be bonding well with the infant and appropriate.  FOB is involved and supportive, as per pt. CSW available to assist further as needed.      VI SOCIAL WORK PLAN Social Work Plan  No Further Intervention Required / No Barriers to Discharge   Type of pt/family education:   If child protective services report - county:   If child protective services report - date:   Information/referral to community resources comment:   Other social work plan:

## 2012-10-07 NOTE — Progress Notes (Signed)
UR chart review completed.  

## 2012-10-08 MED ORDER — OXYCODONE-ACETAMINOPHEN 5-325 MG PO TABS
1.0000 | ORAL_TABLET | ORAL | Status: AC | PRN
Start: 2012-10-08 — End: ?

## 2012-10-08 MED ORDER — IBUPROFEN 600 MG PO TABS: 600.0000 mg | ORAL_TABLET | Freq: Four times a day (QID) | ORAL | Status: AC

## 2012-10-08 NOTE — Progress Notes (Signed)
Post Partum Day 2 Subjective: no complaints and tolerating PO  Objective: Blood pressure 114/75, pulse 81, temperature 98.3 F (36.8 C), temperature source Oral, resp. rate 18, height 5\' 5"  (1.651 m), weight 89.359 kg (197 lb), last menstrual period 01/06/2012, SpO2 98.00%, unknown if currently breastfeeding.  Physical Exam:  General: alert and cooperative Lochia: appropriate Uterine Fundus: firm    Basename 10/07/12 0515 10/06/12 0800  HGB 9.8* 10.7*  HCT 30.4* 32.8*    Assessment/Plan: Discharge home Motrin and percocet Plans Essure pp, to go and do papers at office today   LOS: 2 days   Anna [name] W 10/08/2012, 8:51 AM

## 2012-10-08 NOTE — Discharge Summary (Signed)
Obstetric Discharge Summary Reason for Admission: onset of labor Prenatal Procedures: none Intrapartum Procedures: spontaneous vaginal delivery Postpartum Procedures: none Complications-Operative and Postpartum: none Hemoglobin  Date Value Range Status  10/07/2012 9.8* 12.0 - 15.0 g/dL Final     HCT  Date Value Range Status  10/07/2012 30.4* 36.0 - 46.0 % Final    Physical Exam:  General: alert and cooperative Lochia: appropriate Uterine Fundus: firm   Discharge Diagnoses: Term Pregnancy-delivered  Discharge Information: Date: 10/08/2012 Activity: pelvic rest Diet: routine Medications: Ibuprofen and Percocet Condition: improved Instructions: refer to practice specific booklet Discharge to: home Follow-up Information    Follow up with Oliver Pila, MD. Schedule an appointment as soon as possible for a visit in 6 weeks. (come sign papers for Essure today)    Contact information:   [address], [address] [place] [address] [phone]          Newborn Data: Live born female  Birth Weight: 6 lb 10.7 oz (3025 g) APGAR: 9, 9  Home with mother.  Oliver Pila 10/08/2012, 8:55 AM

## 2013-12-30 ENCOUNTER — Emergency Department (INDEPENDENT_AMBULATORY_CARE_PROVIDER_SITE_OTHER)
Admission: EM | Admit: 2013-12-30 | Discharge: 2013-12-30 | Disposition: A | Discharge status: Home / Self Care | Payer: Self-pay | Source: Home / Self Care | Attending: Emergency Medicine | Admitting: Emergency Medicine

## 2013-12-30 ENCOUNTER — Encounter (HOSPITAL_COMMUNITY): Payer: Self-pay | Admitting: Emergency Medicine

## 2013-12-30 DIAGNOSIS — J020 Streptococcal pharyngitis: Secondary | ICD-10-CM

## 2013-12-30 LAB — POCT RAPID STREP A: Streptococcus, Group A Screen (Direct): POSITIVE — AB

## 2013-12-30 MED ORDER — PREDNISONE 20 MG PO TABS: 20.0000 mg | ORAL_TABLET | Freq: Two times a day (BID) | ORAL | Status: AC

## 2013-12-30 MED ORDER — AMOXICILLIN 500 MG PO CAPS: 500.0000 mg | ORAL_CAPSULE | Freq: Three times a day (TID) | ORAL | Status: AC

## 2013-12-30 NOTE — ED Provider Notes (Signed)
Chief Complaint   Chief Complaint  Patient presents with  . Sore Throat  . Lymphadenopathy    History of Present Illness   Anna [name] is a 28 year old female who's had a two-day history of sore throat, pain on swallowing, subjective fever, chills, headache, nasal congestion, rhinorrhea, and a dry cough. Her glands have been swollen and tender it hurts to open her mouth. She had some diarrhea and nausea. She's had no specific sick exposures. She does have a history of strep throat in the past. She states she gets it about once every year.   Review of Systems   Other than as noted above, the patient denies any of the following symptoms. Systemic:  No fever, chills, sweats, myalgias, or headache. Eye:  No redness, pain or drainage. ENT:  No earache, nasal congestion, sneezing, rhinorrhea, sinus pressure, sinus pain, or post nasal drip. Lungs:  No cough, sputum production, wheezing, shortness of breath, or chest pain. GI:  No abdominal pain, nausea, vomiting, or diarrhea. Skin:  No rash.  PMFSH   Past medical history, family history, social history, meds, and allergies were reviewed.   Physical Exam     Vital signs:  BP 125/80  Pulse 72  Temp(Src) 99.3 F (37.4 C) (Oral)  Resp 16  SpO2 100%  LMP 12/23/2013  Breastfeeding? No General:  Alert, in no distress. Phonation was normal, no drooling, and patient was able to handle secretions well.  Eye:  No conjunctival injection or drainage. Lids were normal. ENT:  TMs and canals were normal, without erythema or inflammation.  Nasal mucosa was clear and uncongested, without drainage.  Mucous membranes were moist.  Exam of pharynx reveals tonsils to be enlarged, red, with spots of white exudate.  There were no oral ulcerations or lesions. There was no bulging of the tonsillar pillars, and the uvula was midline. Neck:  Supple, no adenopathy, tenderness or mass. Lungs:  No respiratory distress.  Lungs were clear to auscultation,  without wheezes, rales or rhonchi.  Breath sounds were clear and equal bilaterally.  Heart:  Regular rhythm, without gallops, murmers or rubs. Skin:  Clear, warm, and dry, without rash or lesions.  Labs   Results for orders placed during the hospital encounter of 12/30/13  POCT RAPID STREP A ([facility] CARE ONLY)      Result Value Ref Range   Streptococcus, Group A Screen (Direct) POSITIVE (*) NEGATIVE   Assessment   The encounter diagnosis was Strep throat.  There is no evidence of a peritonsillar abscess.  She has had frequent recurrences of tonsillitis and strep throat. Need to consider a tonsillectomy.  Plan     1.  Meds:  The following meds were prescribed:   Discharge Medication List as of 12/30/2013  8:21 PM    START taking these medications   Details  amoxicillin (AMOXIL) 500 MG capsule Take 1 capsule (500 mg total) by mouth 3 (three) times daily., Starting 12/30/2013, Until Discontinued, Normal    predniSONE (DELTASONE) 20 MG tablet Take 1 tablet (20 mg total) by mouth 2 (two) times daily., Starting 12/30/2013, Until Discontinued, Normal        2.  Patient Education/Counseling:  The patient was given appropriate handouts, self care instructions, and instructed in symptomatic relief, including hot saline gargles, throat lozenges, infectious precautions, and need to trade out toothbrush.    3.  Follow up:  The patient was told to follow up here if no better in 3 to 4 days, or sooner  if becoming worse in any way, and given some red flag symptoms such as difficulty swallowing or breathing which would prompt immediate return.  Followup with Dr. Suzanna Obey for consideration for tonsillectomy.     Reuben Likes, MD 12/30/13 2035

## 2013-12-30 NOTE — ED Notes (Signed)
C/o  Swollen glands.  Sore throat.  Swollen tonsils with white patches.  Pain with swallowing.  Mild relief with otc meds.  Symptoms present x 2 days.

## 2013-12-30 NOTE — Discharge Instructions (Signed)
Strep Throat  Strep throat is an infection of the throat caused by a bacteria named Streptococcus pyogenes. Your caregiver may call the infection streptococcal "tonsillitis" or "pharyngitis" depending on whether there are signs of inflammation in the tonsils or back of the throat. Strep throat is most common in children aged 29 15 years during the cold months of the year, but it can occur in people of any age during any season. This infection is spread from person to person (contagious) through coughing, sneezing, or other close contact.  SYMPTOMS   · Fever or chills.  · Painful, swollen, red tonsils or throat.  · Pain or difficulty when swallowing.  · White or yellow spots on the tonsils or throat.  · Swollen, tender lymph nodes or "glands" of the neck or under the jaw.  · Red rash all over the body (rare).  DIAGNOSIS   Many different infections can cause the same symptoms. A test must be done to confirm the diagnosis so the right treatment can be given. A "rapid strep test" can help your caregiver make the diagnosis in a few minutes. If this test is not available, a light swab of the infected area can be used for a throat culture test. If a throat culture test is done, results are usually available in a day or two.  TREATMENT   Strep throat is treated with antibiotic medicine.  HOME CARE INSTRUCTIONS   · Gargle with 1 tsp of salt in 1 cup of warm water, 3 4 times per day or as needed for comfort.  · Family members who also have a sore throat or fever should be tested for strep throat and treated with antibiotics if they have the strep infection.  · Make sure everyone in your household washes their hands well.  · Do not share food, drinking cups, or personal items that could cause the infection to spread to others.  · You may need to eat a soft food diet until your sore throat gets better.  · Drink enough water and fluids to keep your urine clear or pale yellow. This will help prevent dehydration.  · Get plenty of  rest.  · Stay home from school, daycare, or work until you have been on antibiotics for 24 hours.  · Only take over-the-counter or prescription medicines for pain, discomfort, or fever as directed by your caregiver.  · If antibiotics are prescribed, take them as directed. Finish them even if you start to feel better.  SEEK MEDICAL CARE IF:   · The glands in your neck continue to enlarge.  · You develop a rash, cough, or earache.  · You cough up green, yellow-brown, or bloody sputum.  · You have pain or discomfort not controlled by medicines.  · Your problems seem to be getting worse rather than better.  SEEK IMMEDIATE MEDICAL CARE IF:   · You develop any new symptoms such as vomiting, severe headache, stiff or painful neck, chest pain, shortness of breath, or trouble swallowing.  · You develop severe throat pain, drooling, or changes in your voice.  · You develop swelling of the neck, or the skin on the neck becomes red and tender.  · You have a fever.  · You develop signs of dehydration, such as fatigue, dry mouth, and decreased urination.  · You become increasingly sleepy, or you cannot wake up completely.  Document Released: 10/17/2000 Document Revised: 10/06/2012 Document Reviewed: 12/19/2010  ExitCare® Patient Information ©2014 [employer].

## 2014-09-04 ENCOUNTER — Encounter (HOSPITAL_COMMUNITY): Payer: Self-pay | Admitting: Emergency Medicine

## 2015-01-01 ENCOUNTER — Encounter (HOSPITAL_COMMUNITY): Payer: Self-pay | Admitting: Emergency Medicine

## 2015-01-01 ENCOUNTER — Emergency Department (HOSPITAL_COMMUNITY): Payer: Self-pay

## 2015-01-01 ENCOUNTER — Emergency Department (HOSPITAL_COMMUNITY)
Admission: EM | Admit: 2015-01-01 | Discharge: 2015-01-01 | Disposition: A | Discharge status: Home / Self Care | Payer: Self-pay | Attending: Emergency Medicine | Admitting: Emergency Medicine

## 2015-01-01 DIAGNOSIS — Z720 Tobacco use: Secondary | ICD-10-CM | POA: Insufficient documentation

## 2015-01-01 DIAGNOSIS — J069 Acute upper respiratory infection, unspecified: Secondary | ICD-10-CM | POA: Insufficient documentation

## 2015-01-01 DIAGNOSIS — Z792 Long term (current) use of antibiotics: Secondary | ICD-10-CM | POA: Insufficient documentation

## 2015-01-01 DIAGNOSIS — R110 Nausea: Secondary | ICD-10-CM | POA: Insufficient documentation

## 2015-01-01 DIAGNOSIS — J029 Acute pharyngitis, unspecified: Secondary | ICD-10-CM

## 2015-01-01 DIAGNOSIS — Z7952 Long term (current) use of systemic steroids: Secondary | ICD-10-CM | POA: Insufficient documentation

## 2015-01-01 DIAGNOSIS — K5900 Constipation, unspecified: Secondary | ICD-10-CM | POA: Insufficient documentation

## 2015-01-01 MED ORDER — DEXAMETHASONE SODIUM PHOSPHATE 10 MG/ML IJ SOLN
5.0000 mg | Freq: Once | INTRAMUSCULAR | Status: AC
  Administered 2015-01-01: 5 mg via INTRAMUSCULAR
  Filled 2015-01-01: qty 1

## 2015-01-01 MED ORDER — IBUPROFEN 200 MG PO TABS
600.0000 mg | ORAL_TABLET | Freq: Once | ORAL | Status: AC
  Administered 2015-01-01: 600 mg via ORAL
  Filled 2015-01-01: qty 3

## 2015-01-01 NOTE — ED Notes (Signed)
[facility] Expected date:  Expected time:  Means of arrival:  Comments: Hold for garrett

## 2015-01-01 NOTE — ED Provider Notes (Signed)
CSN: [id number]     Arrival date & time 01/01/15  1555 History  This chart was scribed for Oswaldo Conroy, PA-C, working with Lyanne Co, MD by Jolene Provost, ED Scribe. This patient was seen in room WTR9/WTR9 and the patient's care was started at 4:23 PM.     Chief Complaint  Patient presents with  . Sore Throat  . Cough  . Generalized Body Aches    HPI  HPI Comments: Anna [name] is a 29 y.o. female who presents to the Emergency Department complaining of sore throat, cough productive of thick sputum with some blood streaks for the last two days. Blood clears from sputum. Pt also endorses associated sinus voice disturbance, congestion, nausea, and constipation. Pt states she has been unable to eat more than soup due to the pain in her throat. Pt denies a hx of asthma. Pt smokes, and states she has not smoked in two days. Pt states her PCP is her gynecologist, Dr. Senaida Ores.  No history of recent surgery or trauma no immobilization, no history of DVT, PE, estrogen use, unilateral leg swelling.   Past Medical History  Diagnosis Date  . No pertinent past medical history   . Pregnant    Past Surgical History  Procedure Laterality Date  . Dilation and curettage of uterus    . Abortions     Family History  Problem Relation Age of Onset  . Kidney disease Maternal Aunt   . Diabetes Maternal Aunt   . COPD Maternal Aunt   . Diabetes Maternal Grandmother   . Cancer Maternal Grandmother   . COPD Maternal Grandmother   . Kidney disease Maternal Grandfather   . Cancer Maternal Grandfather   . COPD Maternal Grandfather   . Diabetes Cousin    History  Substance Use Topics  . Smoking status: Current Some Day Smoker -- 0.25 packs/day    Last Attempt to Quit: 05/27/2012  . Smokeless tobacco: Never Used  . Alcohol Use: No   OB History    Gravida Para Term Preterm AB TAB SAB Ectopic Multiple Living   Review of Systems  Constitutional: Negative for fever and  chills.  HENT: Positive for congestion, rhinorrhea, sinus pressure and voice change.   Respiratory: Positive for cough and chest tightness. Negative for shortness of breath.   Gastrointestinal: Positive for nausea and constipation.  Musculoskeletal: Positive for myalgias.  Neurological: Positive for headaches.      Allergies  Review of patient's allergies indicates no known allergies.  Home Medications   Prior to Admission medications   Medication Sig Start Date End Date Taking? Authorizing Provider  ibuprofen (ADVIL,MOTRIN) 200 MG tablet Take 400 mg by mouth every 6 (six) hours as needed for fever or moderate pain.   Yes Historical Provider, MD  amoxicillin (AMOXIL) 500 MG capsule Take 1 capsule (500 mg total) by mouth 3 (three) times daily. Patient not taking: Reported on 01/01/2015 12/30/13   Reuben Likes, MD  ibuprofen (ADVIL,MOTRIN) 600 MG tablet Take 1 tablet (600 mg total) by mouth every 6 (six) hours. Patient not taking: Reported on 01/01/2015 10/08/12   Oliver Pila, MD  oxyCODONE-acetaminophen (PERCOCET/ROXICET) 5-325 MG per tablet Take 1-2 tablets by mouth every 3 (three) hours as needed (moderate - severe pain). Patient not taking: Reported on 01/01/2015 10/08/12   Oliver Pila, MD  predniSONE (DELTASONE) 20 MG tablet Take 1 tablet (20 mg total) by  mouth 2 (two) times daily. Patient not taking: Reported on 01/01/2015 12/30/13   Reuben Likesavid C Keller, MD   BP 119/50 mmHg  Pulse 67  Temp(Src) 97.5 F (36.4 C) (Oral)  Resp 18  SpO2 96%  LMP 12/11/2014 Physical Exam  Constitutional: She appears well-developed and well-nourished. No distress.  HENT:  Head: Normocephalic and atraumatic.  Nose: Right sinus exhibits no maxillary sinus tenderness and no frontal sinus tenderness. Left sinus exhibits no maxillary sinus tenderness and no frontal sinus tenderness.  Mouth/Throat: Mucous membranes are normal. Posterior oropharyngeal edema and posterior oropharyngeal erythema  present. No oropharyngeal exudate.  No trismus or uvula deviation.  Eyes: Conjunctivae and EOM are normal. Right eye exhibits no discharge. Left eye exhibits no discharge.  Neck: Normal range of motion. Neck supple.  Cardiovascular: Normal rate, regular rhythm and normal heart sounds.   Pulmonary/Chest: Effort normal and breath sounds normal. No respiratory distress. She has no wheezes. She has no rales.  Abdominal: Soft. Bowel sounds are normal. She exhibits no distension. There is no tenderness.  Lymphadenopathy:    She has cervical adenopathy.  Neurological: She is alert.  Skin: Skin is warm and dry. She is not diaphoretic.  Nursing note and vitals reviewed.   ED Course  Procedures  DIAGNOSTIC STUDIES: Oxygen Saturation is 96% on RA, normal by my interpretation.    COORDINATION OF CARE: 4:29 PM Discussed treatment plan with pt at bedside and pt agreed to plan.  Labs Review Labs Reviewed - No data to display  Imaging Review Dg Chest 2 View  01/01/2015   CLINICAL DATA:  Acute cough, congestion and fever.  Laryngitis.  EXAM: CHEST  2 VIEW  COMPARISON:  None.  FINDINGS: The heart size and mediastinal contours are within normal limits. Both lungs are clear. The visualized skeletal structures are unremarkable. Artifact overlies the clavicle regions and lung apices bilaterally.  IMPRESSION: No active cardiopulmonary disease.   Electronically Signed   By: Judie PetitM.  Shick M.D.   On: 01/01/2015 16:52     EKG Interpretation None      MDM   Final diagnoses:  URI (upper respiratory infection)  Pharyngitis   Pt CXR negative for acute infiltrate. Patients symptoms are consistent with URI, likely viral etiology. Discussed that antibiotics are not indicated for viral infections. Patient also was streaks of blood in her sputum. No tachycardia or tachypnea. Patient low risk for PE and I doubt PE in the setting of URI. Patient also with oropharynx swelling and erythema but no exudate. I doubt strep  throat due to Centor criteria and patient with cough and no fever. Patient with cervical lymphadenopathy and dysphagia. Steroids and ibuprofen in ED with improvement of her symptoms. Patient able drink water without difficulty. No trismus or uvula deviation and I doubt peritonsillar abscess. Pt will be discharged with symptomatic treatment.  Verbalizes understanding and is agreeable with plan. Pt is hemodynamically stable & in NAD prior to dc.  Discussed return precautions with patient. Discussed all results and patient verbalizes understanding and agrees with plan.  I personally performed the services described in this documentation, which was scribed in my presence. The recorded information has been reviewed and is accurate.   Louann SjogrenVictoria L Nene Aranas, PA-C 01/01/15 1707  Lyanne CoKevin M Campos, MD 01/01/15 [phone]

## 2015-01-01 NOTE — Discharge Instructions (Signed)
Return to the emergency room with worsening of symptoms, new symptoms or with symptoms that are concerning , especially fevers, stiff neck, worsening headache, nausea/vomiting, visual changes or slurred speech, chest pain, shortness of breath, cough with thick colored mucous or increased amounts of blood Drink plenty of fluids with electrolytes especially Gatorade. OTC cold medications such as mucinex, nyquil, dayquil are recommended. Chloraseptic for sore throat. Ibuprofen for generalized aches and pains.  Follow up with primary care for persistent symptoms. Read below information and follow recommendations.   Pharyngitis Pharyngitis is redness, pain, and swelling (inflammation) of your pharynx.  CAUSES  Pharyngitis is usually caused by infection. Most of the time, these infections are from viruses (viral) and are part of a cold. However, sometimes pharyngitis is caused by bacteria (bacterial). Pharyngitis can also be caused by allergies. Viral pharyngitis may be spread from person to person by coughing, sneezing, and personal items or utensils (cups, forks, spoons, toothbrushes). Bacterial pharyngitis may be spread from person to person by more intimate contact, such as kissing.  SIGNS AND SYMPTOMS  Symptoms of pharyngitis include:   Sore throat.   Tiredness (fatigue).   Low-grade fever.   Headache.  Joint pain and muscle aches.  Skin rashes.  Swollen lymph nodes.  Plaque-like film on throat or tonsils (often seen with bacterial pharyngitis). DIAGNOSIS  Your health care provider will ask you questions about your illness and your symptoms. Your medical history, along with a physical exam, is often all that is needed to diagnose pharyngitis. Sometimes, a rapid strep test is done. Other lab tests may also be done, depending on the suspected cause.  TREATMENT  Viral pharyngitis will usually get better in 3-4 days without the use of medicine. Bacterial pharyngitis is treated with  medicines that kill germs (antibiotics).  HOME CARE INSTRUCTIONS   Drink enough water and fluids to keep your urine clear or pale yellow.   Only take over-the-counter or prescription medicines as directed by your health care provider:   If you are prescribed antibiotics, make sure you finish them even if you start to feel better.   Do not take aspirin.   Get lots of rest.   Gargle with 8 oz of salt water ( tsp of salt per 1 qt of water) as often as every 1-2 hours to soothe your throat.   Throat lozenges (if you are not at risk for choking) or sprays may be used to soothe your throat. SEEK MEDICAL CARE IF:   You have large, tender lumps in your neck.  You have a rash.  You cough up green, yellow-brown, or bloody spit. SEEK IMMEDIATE MEDICAL CARE IF:   Your neck becomes stiff.  You drool or are unable to swallow liquids.  You vomit or are unable to keep medicines or liquids down.  You have severe pain that does not go away with the use of recommended medicines.  You have trouble breathing (not caused by a stuffy nose). MAKE SURE YOU:   Understand these instructions.  Will watch your condition.  Will get help right away if you are not doing well or get worse. Document Released: 10/20/2005 Document Revised: 08/10/2013 Document Reviewed: 06/27/2013 [facility] Patient Information 2015 [place], [place]. This information is not intended to replace advice given to you by your health care provider. Make sure you discuss any questions you have with your health care provider.  Cough, Adult  A cough is a reflex that helps clear your throat and airways. It can help heal  the body or may be a reaction to an irritated airway. A cough may only last 2 or 3 weeks (acute) or may last more than 8 weeks (chronic).  CAUSES Acute cough:  Viral or bacterial infections. Chronic cough:  Infections.  Allergies.  Asthma.  Post-nasal drip.  Smoking.  Heartburn or acid  reflux.  Some medicines.  Chronic lung problems (COPD).  Cancer. SYMPTOMS   Cough.  Fever.  Chest pain.  Increased breathing rate.  High-pitched whistling sound when breathing (wheezing).  Colored mucus that you cough up (sputum). TREATMENT   A bacterial cough may be treated with antibiotic medicine.  A viral cough must run its course and will not respond to antibiotics.  Your caregiver may recommend other treatments if you have a chronic cough. HOME CARE INSTRUCTIONS   Only take over-the-counter or prescription medicines for pain, discomfort, or fever as directed by your caregiver. Use cough suppressants only as directed by your caregiver.  Use a cold steam vaporizer or humidifier in your bedroom or home to help loosen secretions.  Sleep in a semi-upright position if your cough is worse at night.  Rest as needed.  Stop smoking if you smoke. SEEK IMMEDIATE MEDICAL CARE IF:   You have pus in your sputum.  Your cough starts to worsen.  You cannot control your cough with suppressants and are losing sleep.  You begin coughing up blood.  You have difficulty breathing.  You develop pain which is getting worse or is uncontrolled with medicine.  You have a fever. MAKE SURE YOU:   Understand these instructions.  Will watch your condition.  Will get help right away if you are not doing well or get worse. Document Released: 04/18/2011 Document Revised: 01/12/2012 Document Reviewed: 04/18/2011 [facility] Patient Information 2015 [place]. This information is not intended to replace advice given to you by your health care provider. Make sure you discuss any questions you have with your health care provider.

## 2015-01-01 NOTE — ED Notes (Signed)
Pt states that she woke up on Saturday with sore throat and congestion, headache, swelling to throat, loss of voice, productive cough with streaks of red blood through phlegm.

## 2015-03-08 ENCOUNTER — Encounter (HOSPITAL_COMMUNITY): Payer: Self-pay | Admitting: Emergency Medicine

## 2015-03-08 ENCOUNTER — Emergency Department (HOSPITAL_COMMUNITY)
Admission: EM | Admit: 2015-03-08 | Discharge: 2015-03-08 | Disposition: A | Discharge status: Home / Self Care | Payer: Self-pay | Attending: Emergency Medicine | Admitting: Emergency Medicine

## 2015-03-08 DIAGNOSIS — M722 Plantar fascial fibromatosis: Secondary | ICD-10-CM | POA: Insufficient documentation

## 2015-03-08 DIAGNOSIS — Z720 Tobacco use: Secondary | ICD-10-CM | POA: Insufficient documentation

## 2015-03-08 NOTE — Discharge Instructions (Signed)
Please use 4-6 600 mg ibuprofen every 6 hours for 5 days. Please use frozen water bottle for fascial massage as needed for pain. Please wear shoes with arch support, reduce walking. [facility] needed if symptoms continue to persist.

## 2015-03-08 NOTE — ED Notes (Signed)
Pt reports 3 day hx of of left heel pain. Painful to walk/stand. Took 400mg  ibuprofen this morning at 6am.

## 2015-03-08 NOTE — ED Provider Notes (Signed)
CSN: [id number]     Arrival date & time 03/08/15  1252 History  This chart was scribed for non-physician practitioner, Eyvonne MechanicJeffrey Giuliano Preece, working with Benjiman CoreNathan Pickering, MD by Richarda Overlieichard Holland, ED Scribe. This patient was seen in room TR07C/TR07C and the patient's care was started at 1:17 PM.   Chief Complaint  Patient presents with  . Foot Pain   The history is provided by the patient. No language interpreter was used.   HPI Comments: Anna [name] is a 29 y.o. female who presents to the Emergency Department complaining of gradually worsening left foot pain for the last 3 days. Pt states she woke up this morning and could not weight bear on her left foot and says her pain is worse in the morning. Pt describes her pain as sharp and reports her worst pain is towards her left heel on the bottom of her foot. She says she has been taking ibuprofen 400mg  with some short term relief but states it provided her no relief this morning. Pt reports she is on her feet frequently at work. She says that she has probably gained weight recently and states she does not exercise. She reports that she went to Urgent Care and was dx with plantar fasciatus 1 year ago and says that her current symptoms feel similar to that episode. Pt denies any hx of kidney or stomach problems.    Past Medical History  Diagnosis Date  . No pertinent past medical history   . Pregnant    Past Surgical History  Procedure Laterality Date  . Dilation and curettage of uterus    . Abortions     Family History  Problem Relation Age of Onset  . Kidney disease Maternal Aunt   . Diabetes Maternal Aunt   . COPD Maternal Aunt   . Diabetes Maternal Grandmother   . Cancer Maternal Grandmother   . COPD Maternal Grandmother   . Kidney disease Maternal Grandfather   . Cancer Maternal Grandfather   . COPD Maternal Grandfather   . Diabetes Cousin    History  Substance Use Topics  . Smoking status: Current Some Day Smoker -- 0.25 packs/day     Last Attempt to Quit: 05/27/2012  . Smokeless tobacco: Never Used  . Alcohol Use: No   OB History    Gravida Para Term Preterm AB TAB SAB Ectopic Multiple Living   6 2 2  4 3 1   2      Review of Systems  Musculoskeletal: Positive for myalgias and gait problem.  All other systems reviewed and are negative.  Allergies  Review of patient's allergies indicates no known allergies.  Home Medications   Prior to Admission medications   Medication Sig Start Date End Date Taking? Authorizing Provider  amoxicillin (AMOXIL) 500 MG capsule Take 1 capsule (500 mg total) by mouth 3 (three) times daily. Patient not taking: Reported on 01/01/2015 12/30/13   Reuben Likesavid C Keller, MD  ibuprofen (ADVIL,MOTRIN) 200 MG tablet Take 400 mg by mouth every 6 (six) hours as needed for fever or moderate pain.    Historical Provider, MD  ibuprofen (ADVIL,MOTRIN) 600 MG tablet Take 1 tablet (600 mg total) by mouth every 6 (six) hours. Patient not taking: Reported on 01/01/2015 10/08/12   Huel CoteKathy Richardson, MD  oxyCODONE-acetaminophen (PERCOCET/ROXICET) 5-325 MG per tablet Take 1-2 tablets by mouth every 3 (three) hours as needed (moderate - severe pain). Patient not taking: Reported on 01/01/2015 10/08/12   Huel CoteKathy Richardson, MD  predniSONE (DELTASONE) 20 MG  tablet Take 1 tablet (20 mg total) by mouth 2 (two) times daily. Patient not taking: Reported on 01/01/2015 12/30/13   Reuben Likesavid C Keller, MD   BP 113/76 mmHg  Pulse 73  Temp(Src) 98.3 F (36.8 C) (Oral)  Resp 20  SpO2 100%  LMP 03/06/2015 Physical Exam  Constitutional: She is oriented to person, place, and time. She appears well-developed and well-nourished.  HENT:  Head: Normocephalic and atraumatic.  Eyes: Pupils are equal, round, and reactive to light.  Neck: Normal range of motion. Neck supple. No JVD present. No tracheal deviation present. No thyromegaly present.  Cardiovascular: Normal rate, regular rhythm, normal heart sounds and intact distal pulses.  Exam  reveals no gallop and no friction rub.   No murmur heard. Pulmonary/Chest: Effort normal and breath sounds normal. No stridor. No respiratory distress. She has no wheezes. She has no rales. She exhibits no tenderness.  Abdominal: She exhibits no distension.  Musculoskeletal:  Non-tender to palpation of knee. No signs of trauma or injury. No swelling. Anterior drawer negative. Eversion/inversion negative. Non-tender to palpation of the calcaneous tendon.   Lymphadenopathy:    She has no cervical adenopathy.  Neurological: She is alert and oriented to person, place, and time. Coordination normal.  Skin: Skin is warm and dry.  Psychiatric: She has a normal mood and affect. Her behavior is normal. Judgment and thought content normal.  Nursing note and vitals reviewed.   ED Course  Procedures   DIAGNOSTIC STUDIES: Oxygen Saturation is 100% on RA, normal by my interpretation.    COORDINATION OF CARE: 1:27 PM Discussed treatment plan with pt at bedside and pt agreed to plan. Advised pt to take ibuprofen 600 mg 3 times daily for 5 days. Reccommended pt to roll her foot on a frozen water bottle to provide relief for plantar fasciatus. Discussed benefits of weight loss and shoe support for plantar fasciatus. Follow up with [facility] if patient's symptoms do not improve.    Labs Review Labs Reviewed - No data to display  Imaging Review No results found.   EKG Interpretation None      MDM   Final diagnoses:  Plantar fasciitis    Imaging: None indicated  Assessment: Plantar fasciitis  Plan: Patient has plantar fasciitis, reports that she's been dealing with it for most of her life. Denies recent trauma to the foot, has recently had waking, wearing flat soled shoes, with an increased workload. She reports these symptoms are similar to previous episodes. She reports she was given orthopedic follow-up but did not and does not want to follow-up with orthopedic surgeon.  Patient instructions are noted above, patient understood and agreed to today's plan, and assured her follow-up evaluation.    I personally performed the services described in this documentation, which was scribed in my presence. The recorded information has been reviewed and is accurate.      Eyvonne MechanicJeffrey Maddoxx Burkitt, PA-C 03/08/15 1932  Benjiman CoreNathan Pickering, MD 03/09/15 [phone]

## 2015-10-20 ENCOUNTER — Encounter (HOSPITAL_COMMUNITY): Payer: Self-pay | Admitting: Emergency Medicine

## 2015-10-20 ENCOUNTER — Emergency Department (HOSPITAL_COMMUNITY)
Admission: EM | Admit: 2015-10-20 | Discharge: 2015-10-20 | Disposition: A | Discharge status: Home / Self Care | Payer: Self-pay | Attending: Emergency Medicine | Admitting: Emergency Medicine

## 2015-10-20 DIAGNOSIS — E876 Hypokalemia: Secondary | ICD-10-CM | POA: Insufficient documentation

## 2015-10-20 DIAGNOSIS — R1110 Vomiting, unspecified: Secondary | ICD-10-CM

## 2015-10-20 DIAGNOSIS — F17200 Nicotine dependence, unspecified, uncomplicated: Secondary | ICD-10-CM | POA: Insufficient documentation

## 2015-10-20 DIAGNOSIS — R197 Diarrhea, unspecified: Secondary | ICD-10-CM | POA: Insufficient documentation

## 2015-10-20 DIAGNOSIS — Z3202 Encounter for pregnancy test, result negative: Secondary | ICD-10-CM | POA: Insufficient documentation

## 2015-10-20 LAB — CBC WITH DIFFERENTIAL/PLATELET
Basophils Absolute: 0 10*3/uL (ref 0.0–0.1)
Basophils Relative: 1 %
Eosinophils Absolute: 0.1 10*3/uL (ref 0.0–0.7)
Eosinophils Relative: 2 %
HCT: 34 % — ABNORMAL LOW (ref 36.0–46.0)
Hemoglobin: 10.9 g/dL — ABNORMAL LOW (ref 12.0–15.0)
Lymphocytes Relative: 29 %
Lymphs Abs: 1.1 10*3/uL (ref 0.7–4.0)
MCH: 26.8 pg (ref 26.0–34.0)
MCHC: 32.1 g/dL (ref 30.0–36.0)
MCV: 83.7 fL (ref 78.0–100.0)
Monocytes Absolute: 0.2 10*3/uL (ref 0.1–1.0)
Monocytes Relative: 5 %
Neutro Abs: 2.4 10*3/uL (ref 1.7–7.7)
Neutrophils Relative %: 63 %
Platelets: 194 10*3/uL (ref 150–400)
RBC: 4.06 MIL/uL (ref 3.87–5.11)
RDW: 14.4 % (ref 11.5–15.5)
WBC: 3.8 10*3/uL — ABNORMAL LOW (ref 4.0–10.5)

## 2015-10-20 LAB — URINALYSIS, ROUTINE W REFLEX MICROSCOPIC
Bilirubin Urine: NEGATIVE
Glucose, UA: NEGATIVE mg/dL
Hgb urine dipstick: NEGATIVE
Ketones, ur: 15 mg/dL — AB
Leukocytes, UA: NEGATIVE
Nitrite: NEGATIVE
Protein, ur: NEGATIVE mg/dL
Specific Gravity, Urine: 1.022 (ref 1.005–1.030)
pH: 6 (ref 5.0–8.0)

## 2015-10-20 LAB — COMPREHENSIVE METABOLIC PANEL
ALT: 43 U/L (ref 14–54)
AST: 45 U/L — ABNORMAL HIGH (ref 15–41)
Albumin: 3.3 g/dL — ABNORMAL LOW (ref 3.5–5.0)
Alkaline Phosphatase: 60 U/L (ref 38–126)
Anion gap: 9 (ref 5–15)
BUN: <5 mg/dL — ABNORMAL LOW (ref 6–20)
CO2: 23 mmol/L (ref 22–32)
Calcium: 8 mg/dL — ABNORMAL LOW (ref 8.9–10.3)
Chloride: 106 mmol/L (ref 101–111)
Creatinine, Ser: 0.99 mg/dL (ref 0.44–1.00)
GFR calc Af Amer: >60 mL/min (ref >60)
GFR calc non Af Amer: >60 mL/min (ref >60)
Glucose, Bld: 89 mg/dL (ref 65–99)
Potassium: 2.6 mmol/L — CL (ref 3.5–5.1)
Sodium: 138 mmol/L (ref 135–145)
Total Bilirubin: 0.6 mg/dL (ref 0.3–1.2)
Total Protein: 6.2 g/dL — ABNORMAL LOW (ref 6.5–8.1)

## 2015-10-20 LAB — POC URINE PREG, ED: Preg Test, Ur: NEGATIVE

## 2015-10-20 MED ORDER — SODIUM CHLORIDE 0.9 % IV BOLUS (SEPSIS)
1000.0000 mL | Freq: Once | INTRAVENOUS | Status: AC
  Administered 2015-10-20: 1000 mL via INTRAVENOUS

## 2015-10-20 MED ORDER — ONDANSETRON 4 MG PO TBDP
4.0000 mg | ORAL_TABLET | Freq: Once | ORAL | Status: AC
  Administered 2015-10-20: 4 mg via ORAL
  Filled 2015-10-20: qty 1

## 2015-10-20 MED ORDER — POTASSIUM CHLORIDE CRYS ER 20 MEQ PO TBCR: 20.0000 meq | EXTENDED_RELEASE_TABLET | Freq: Every day | ORAL | Status: AC

## 2015-10-20 MED ORDER — POTASSIUM CHLORIDE 10 MEQ/100ML IV SOLN
10.0000 meq | Freq: Once | INTRAVENOUS | Status: AC
  Administered 2015-10-20: 10 meq via INTRAVENOUS
  Filled 2015-10-20: qty 100

## 2015-10-20 MED ORDER — POTASSIUM CHLORIDE CRYS ER 20 MEQ PO TBCR
60.0000 meq | EXTENDED_RELEASE_TABLET | Freq: Once | ORAL | Status: AC
  Administered 2015-10-20: 60 meq via ORAL
  Filled 2015-10-20: qty 3

## 2015-10-20 NOTE — Discharge Instructions (Signed)
If diarrhea persists have potassium rechecked early next week. Stay well-hydrated.  If you were given medicines take as directed.  If you are on coumadin or contraceptives realize their levels and effectiveness is altered by many different medicines.  If you have any reaction (rash, tongues swelling, other) to the medicines stop taking and see a physician.    If your blood pressure was elevated in the ER make sure you follow up for management with a primary doctor or return for chest pain, shortness of breath or stroke symptoms.  Please follow up as directed and return to the ER or see a physician for new or worsening symptoms.  Thank you. Filed Vitals:   10/20/15 1915  BP: 111/75  Pulse: 74  Temp: 98.7 F (37.1 C)  Resp: 20  Height: 5\' 5"  (1.651 m)  Weight: 185 lb (83.915 kg)  SpO2: 99%

## 2015-10-20 NOTE — ED Notes (Signed)
Pt c/o nausea, vomiting and diarrhea onset 12/13.  Pt st's she has not vomited since Wed. But has continued to have diarrhea.  Pt st's she donated Plasma on 12/13 and she took a lot of iron prior to going and thinks this may be the cause. Has used Imodium without relief

## 2015-10-20 NOTE — ED Provider Notes (Signed)
CSN: [id number]     Arrival date & time 10/20/15  1904 History   First MD Initiated Contact with Patient 10/20/15 2019     Chief Complaint  Patient presents with  . Emesis     (Consider location/radiation/quality/duration/timing/severity/associated sxs/prior Treatment) HPI Comments:  29 year old female with no significant medical history presents with recurrent diarrhea nonbloody. Patient initially had vomiting that started on the 13th after she donated plasma. 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 Patient tried Imodium without relief. No fevers or recent travel. Mild abdominal cramping but no pain. Vomiting resolved however diarrhea persists. No recent antibiotics.  Patient is a 29 y.o. female presenting with vomiting. The history is provided by the patient.  Emesis Associated symptoms: diarrhea   Associated symptoms: no abdominal pain, no chills and no headaches     Past Medical History  Diagnosis Date  . No pertinent past medical history   . Pregnant    Past Surgical History  Procedure Laterality Date  . Dilation and curettage of uterus    . Abortions     Family History  Problem Relation Age of Onset  . Kidney disease Maternal Aunt   . Diabetes Maternal Aunt   . COPD Maternal Aunt   . Diabetes Maternal Grandmother   . Cancer Maternal Grandmother   . COPD Maternal Grandmother   . Kidney disease Maternal Grandfather   . Cancer Maternal Grandfather   . COPD Maternal Grandfather   . Diabetes Cousin    Social History  Substance Use Topics  . Smoking status: Current Some Day Smoker -- 0.25 packs/day    Last Attempt to Quit: 05/27/2012  . Smokeless tobacco: Never Used  . Alcohol Use: No   OB History    Gravida Para Term Preterm AB TAB SAB Ectopic Multiple Living   Review of Systems  Constitutional: Negative for fever and chills.  HENT: Negative for congestion.   Eyes: Negative for visual disturbance.   Respiratory: Negative for shortness of breath.   Cardiovascular: Negative for chest pain.  Gastrointestinal: Positive for nausea, vomiting and diarrhea. Negative for abdominal pain.  Genitourinary: Negative for dysuria and flank pain.  Musculoskeletal: Negative for back pain, neck pain and neck stiffness.  Skin: Negative for rash.  Neurological: Negative for light-headedness and headaches.      Allergies  Review of patient's allergies indicates no known allergies.  Home Medications   Prior to Admission medications   Medication Sig Start Date End Date Taking? Authorizing Provider  ibuprofen (ADVIL,MOTRIN) 200 MG tablet Take 400-800 mg by mouth every 8 (eight) hours as needed for fever or moderate pain.    Yes Historical Provider, MD  amoxicillin (AMOXIL) 500 MG capsule Take 1 capsule (500 mg total) by mouth 3 (three) times daily. Patient not taking: Reported on 01/01/2015 12/30/13   Reuben Likes, MD  ibuprofen (ADVIL,MOTRIN) 600 MG tablet Take 1 tablet (600 mg total) by mouth every 6 (six) hours. Patient not taking: Reported on 01/01/2015 10/08/12   Huel Cote, MD  oxyCODONE-acetaminophen (PERCOCET/ROXICET) 5-325 MG per tablet Take 1-2 tablets by mouth every 3 (three) hours as needed (moderate - severe pain). Patient not taking: Reported on 01/01/2015 10/08/12   Huel Cote, MD  potassium chloride SA (K-DUR,KLOR-CON) 20 MEQ tablet Take 1 tablet (20 mEq total) by mouth daily. 10/20/15   Blane Ohara, MD  predniSONE (DELTASONE) 20 MG tablet Take 1  tablet (20 mg total) by mouth 2 (two) times daily. Patient not taking: Reported on 01/01/2015 12/30/13   Reuben Likesavid C Keller, MD   BP 97/79 mmHg  Pulse 74  Temp(Src) 98.7 F (37.1 C)  Resp 20  Ht 5\' 5"  (1.651 m)  Wt 185 lb (83.915 kg)  BMI 30.79 kg/m2  SpO2 100%  LMP 10/03/2015 Physical Exam  Constitutional: She is oriented to person, place, and time. She appears well-developed and well-nourished.  HENT:  Head: Normocephalic and  atraumatic.  Mild dry mucous membranes  Eyes: Conjunctivae are normal. Right eye exhibits no discharge. Left eye exhibits no discharge.  Neck: Normal range of motion. Neck supple. No tracheal deviation present.  Cardiovascular: Normal rate and regular rhythm.   Pulmonary/Chest: Effort normal and breath sounds normal.  Abdominal: Soft. She exhibits no distension. There is no tenderness. There is no guarding.  Musculoskeletal: She exhibits no edema.  Neurological: She is alert and oriented to person, place, and time.  Skin: Skin is warm. No rash noted.  Psychiatric: She has a normal mood and affect.  Nursing note and vitals reviewed.   ED Course  Procedures (including critical care time) Labs Review Labs Reviewed  CBC WITH DIFFERENTIAL/PLATELET - Abnormal; Notable for the following:    WBC 3.8 (*)    Hemoglobin 10.9 (*)    HCT 34.0 (*)    All other components within normal limits  COMPREHENSIVE METABOLIC PANEL - Abnormal; Notable for the following:    Potassium 2.6 (*)    BUN <5 (*)    Calcium 8.0 (*)    Total Protein 6.2 (*)    Albumin 3.3 (*)    AST 45 (*)    All other components within normal limits  URINALYSIS, ROUTINE W REFLEX MICROSCOPIC (NOT AT [facility]) - Abnormal; Notable for the following:    Color, Urine AMBER (*)    APPearance CLOUDY (*)    Ketones, ur 15 (*)    All other components within normal limits  POC URINE PREG, ED    Imaging Review No results found. I have personally reviewed and evaluated these images and lab results as part of my medical decision-making.   EKG Interpretation   Date/Time:  Saturday October 20 2015 21:10:42 EST Ventricular Rate:  82 PR Interval:  168 QRS Duration: 83 QT Interval:  413 QTC Calculation: 482 R Axis:   46 Text Interpretation:  Age not entered, assumed to be  29 years old for  purpose of ECG interpretation Sinus rhythm Borderline T abnormalities,  anterior leads Borderline prolonged QT interval Confirmed by Becket Wecker   MD,  Jason Frisbee ([id number]) on 10/20/2015 10:44:05 PM      MDM   Final diagnoses:  Vomiting and diarrhea  Hypokalemia   Well-appearing female with recurrent diarrhea. Blood work reviewed consistent with mild hypokalemia. Plan for EKG to check intervals, IV and oral potassium and close outpatient follow-up.  Results and differential diagnosis were discussed with the patient/parent/guardian. Xrays were independently reviewed by myself.  Close follow up outpatient was discussed, comfortable with the plan.   Medications  ondansetron (ZOFRAN-ODT) disintegrating tablet 4 mg (4 mg Oral Given 10/20/15 2038)  sodium chloride 0.9 % bolus 1,000 mL (1,000 mLs Intravenous New Bag/Given 10/20/15 2105)  potassium chloride SA (K-DUR,KLOR-CON) CR tablet 60 mEq (60 mEq Oral Given 10/20/15 2105)  potassium chloride 10 mEq in 100 mL IVPB (10 mEq Intravenous New Bag/Given 10/20/15 2105)    Filed Vitals:   10/20/15 1915 10/20/15 2045 10/20/15 2100  BP: 111/75 102/81 97/79  Pulse: 74 64 74  Temp: 98.7 F (37.1 C)    Resp: 20    Height:  (1.651 m)    Weight: 185 lb (83.915 kg)    SpO2: 99% 100% 100%    Final diagnoses:  Vomiting and diarrhea  Hypokalemia       Blane Ohara, MD 10/20/15 2244

## 2015-10-20 NOTE — ED Notes (Signed)
Pt. Left with all belongings and refused wheelchair. Discharge were reviewed and all questions were answered

## 2017-05-11 ENCOUNTER — Encounter (HOSPITAL_COMMUNITY): Payer: Self-pay | Admitting: Emergency Medicine

## 2017-05-11 ENCOUNTER — Emergency Department (HOSPITAL_COMMUNITY)
Admission: EM | Admit: 2017-05-11 | Discharge: 2017-05-11 | Disposition: A | Discharge status: Home / Self Care | Payer: Self-pay | Attending: Emergency Medicine | Admitting: Emergency Medicine

## 2017-05-11 DIAGNOSIS — F17210 Nicotine dependence, cigarettes, uncomplicated: Secondary | ICD-10-CM | POA: Insufficient documentation

## 2017-05-11 DIAGNOSIS — R6884 Jaw pain: Secondary | ICD-10-CM

## 2017-05-11 DIAGNOSIS — K047 Periapical abscess without sinus: Secondary | ICD-10-CM | POA: Insufficient documentation

## 2017-05-11 MED ORDER — CHLORHEXIDINE GLUCONATE 0.12 % MT SOLN: 15.0000 mL | Freq: Two times a day (BID) | OROMUCOSAL | 0 refills | Status: AC

## 2017-05-11 MED ORDER — PENICILLIN V POTASSIUM 500 MG PO TABS: 500.0000 mg | ORAL_TABLET | Freq: Four times a day (QID) | ORAL | 0 refills | Status: AC

## 2017-05-11 MED ORDER — PENICILLIN V POTASSIUM 250 MG PO TABS
500.0000 mg | ORAL_TABLET | Freq: Once | ORAL | Status: AC
  Administered 2017-05-11: 500 mg via ORAL
  Filled 2017-05-11: qty 2

## 2017-05-11 NOTE — ED Notes (Signed)
States she cannot open her mouth wide x 1 month states her gums are swollen  And it hurts to eat. Has not seen anyone for this

## 2017-05-11 NOTE — ED Provider Notes (Signed)
[facility]-EMERGENCY DEPT Provider Note   CSN: [id number] Arrival date & time: 05/11/17  [id number]  By signing my name below, I, Thelma Bargeick Cochran, attest that this documentation has been prepared under the direction and in the presence of [facility] Surgery[facility]ina Dulcemaria Bula, PA-C. Electronically Signed: Thelma BargeNick Cochran, Scribe. 05/11/17. 11:51 AM.  History   Chief Complaint Chief Complaint  Patient presents with  . Jaw Pain  . Oral Swelling   The history is provided by the patient. No language interpreter was used.    HPI Comments: Anna [name] is a 31 y.o. female who presents to the Emergency Department complaining of waxing/waning, gradually worsening bilateral jaw pain for 1 month. She has associated soreness to the front of her mouth and chills (chronic and unchanged). She reports difficulty opening her mouth fully due to pain and eating crunchy or hard foods, but has no difficulty drinking liquids or eating soft foods. She states she grinds her teeth often at night and when she gets cold, which she has done chronically for years. She has tried taking 600 mg ibuprofen 2 times daily with mild relief. She denies congestion, fever, SOB, and drooling beyond baseline. She further denies any mechanism of trauma or injury to the face or any falls. Pt is a smoker, 5-6 cigarettes/day. Pt does not have a dentist. She has NKDA.  Past Medical History:  Diagnosis Date  . No pertinent past medical history   . Pregnant     There are no active problems to display for this patient.   Past Surgical History:  Procedure Laterality Date  . abortions    . DILATION AND CURETTAGE OF UTERUS      OB History    Gravida Para Term Preterm AB Living   6 2 2   4 2    SAB TAB Ectopic Multiple Live Births   1 3     2        Home Medications    Prior to Admission medications   Medication Sig Start Date End Date Taking? Authorizing Provider  amoxicillin (AMOXIL) 500 MG capsule Take 1 capsule (500 mg total) by mouth 3 (three) times  daily. Patient not taking: Reported on 01/01/2015 12/30/13   Reuben LikesKeller, David C, MD  chlorhexidine (PERIDEX) 0.12 % solution Use as directed 15 mLs in the mouth or throat 2 (two) times daily. 05/11/17   Feliz Herard A, PA-C  ibuprofen (ADVIL,MOTRIN) 200 MG tablet Take 400-800 mg by mouth every 8 (eight) hours as needed for fever or moderate pain.     [provider]  ibuprofen (ADVIL,MOTRIN) 600 MG tablet Take 1 tablet (600 mg total) by mouth every 6 (six) hours. Patient not taking: Reported on 01/01/2015 10/08/12   Huel Coteichardson, Kathy, MD  oxyCODONE-acetaminophen (PERCOCET/ROXICET) 5-325 MG per tablet Take 1-2 tablets by mouth every 3 (three) hours as needed (moderate - severe pain). Patient not taking: Reported on 01/01/2015 10/08/12   Huel Coteichardson, Kathy, MD  penicillin v potassium (VEETID) 500 MG tablet Take 1 tablet (500 mg total) by mouth 4 (four) times daily. 05/11/17 05/18/17  Michela PitcherFawze, Macon Sandiford A, PA-C  potassium chloride SA (K-DUR,KLOR-CON) 20 MEQ tablet Take 1 tablet (20 mEq total) by mouth daily. 10/20/15   Blane OharaZavitz, Joshua, MD  predniSONE (DELTASONE) 20 MG tablet Take 1 tablet (20 mg total) by mouth 2 (two) times daily. Patient not taking: Reported on 01/01/2015 12/30/13   Reuben LikesKeller, David C, MD    Family History Family History  Problem Relation Age of Onset  . Kidney disease Maternal Aunt   .  Diabetes Maternal Aunt   . COPD Maternal Aunt   . Diabetes Maternal Grandmother   . Cancer Maternal Grandmother   . COPD Maternal Grandmother   . Kidney disease Maternal Grandfather   . Cancer Maternal Grandfather   . COPD Maternal Grandfather   . Diabetes Cousin     Social History Social History  Substance Use Topics  . Smoking status: Current Some Day Smoker    Packs/day: 0.25    Last attempt to quit: 05/27/2012  . Smokeless tobacco: Never Used  . Alcohol use No     Allergies   Patient has no known allergies.   Review of Systems Review of Systems  Constitutional: Positive for chills.  Negative for fever.  HENT: Positive for dental problem. Negative for congestion and drooling.   Respiratory: Negative for shortness of breath.   Skin: Positive for wound.     Physical Exam Updated Vital Signs BP 104/71 (BP Location: Right Arm)   Pulse 67   Temp 98.4 F (36.9 C) (Oral)   Resp 14   Ht 5\' 4"  (1.626 m)   Wt 89 kg (196 lb 5 oz)   LMP 04/22/2017   SpO2 100%   BMI 33.70 kg/m   Physical Exam  Constitutional: She appears well-developed and well-nourished. No distress.  HENT:  Head: Normocephalic and atraumatic.  Right Ear: External ear normal.  Left Ear: External ear normal.  Mouth/Throat: Oropharynx is clear and moist.  Diffusely decaying dentition with gingival recession and moderate evidence of inflammation of the anterior mandible. There is a 4 mm area of fluctuance and tenderness that is centrally white. No tenderness to palpation of the dentition otherwise. Mild TTP of the maxilla externally. Pain elicited on opening of the mouth, but no trismus and no sublingual abnormalities. Airway is patent with no drooling. Posterior oropharynx is clear. Bite alignment appears normal.  Eyes: Conjunctivae are normal. Right eye exhibits no discharge. Left eye exhibits no discharge.  Neck: No JVD present. No tracheal deviation present.  Cardiovascular: Normal rate.   Pulmonary/Chest: Effort normal.  Abdominal: She exhibits no distension.  Musculoskeletal: She exhibits no edema.  Neurological: She is alert.  Skin: No erythema.  Psychiatric: She has a normal mood and affect. Her behavior is normal.  Nursing note and vitals reviewed.    ED Treatments / Results  DIAGNOSTIC STUDIES: Oxygen Saturation is 100% on RA, normal by my interpretation.    COORDINATION OF CARE: 11:51 AM Discussed treatment plan with pt at bedside and pt agreed to plan.  Labs (all labs ordered are listed, but only abnormal results are displayed) Labs Reviewed - No data to display  EKG  EKG  Interpretation None       Radiology No results found.  Procedures .Marland KitchenIncision and Drainage Date/Time: 05/11/2017 12:05 PM Performed by: Michela Pitcher A Authorized by: Michela Pitcher A   Consent:    Consent obtained:  Verbal   Consent given by:  Patient   Risks discussed:  Bleeding, pain and incomplete drainage Location:    Type:  Abscess   Size:  3mm   Location:  Mouth   Mouth location:  Alveolar process Anesthesia (see MAR for exact dosages):    Anesthesia method:  None Procedure type:    Complexity:  Simple Procedure details:    Needle aspiration: yes     Needle size:  18 G   Incision depth:  Dermal   Wound management:  Irrigated with saline   Drainage:  Purulent   Drainage  amount:  Moderate   Wound treatment:  Wound left open Post-procedure details:    Patient tolerance of procedure:  Tolerated well, no immediate complications   (including critical care time)  Medications Ordered in ED Medications  penicillin v potassium (VEETID) tablet 500 mg (500 mg Oral Given 05/11/17 1206)     Initial Impression / Assessment and Plan / ED Course  I have reviewed the triage vital signs and the nursing notes.  Pertinent labs & imaging results that were available during my care of the patient were reviewed by me and considered in my medical decision making (see chart for details).     Patient with one month of jaw pain with additional development of dental pain. Afebrile, vital signs are stable. Exam unconcerning for Ludwig's angina or spread of infection. Superficial abscess noted inferior to the lower left canine, stab incision was successful followed by extensive cleaning. Will treat with penicillin. Suspect jaw pain is related to chronic grinding of teeth versus TMJ. Low suspicion of jaw dislocation or facial injury in the absence of trauma.  Urged patient to follow-up with dentist for complete dental evaluation. Discussed pain management with ibuprofen and Tylenol. Discussed  strict indications for return to the ED. Pt verbalized understanding of and agreement with plan and is safe for discharge home at this time.   Final Clinical Impressions(s) / ED Diagnoses   Final diagnoses:  Dental abscess  Jaw pain    New Prescriptions Discharge Medication List as of 05/11/2017 12:02 PM    START taking these medications   Details  chlorhexidine (PERIDEX) 0.12 % solution Use as directed 15 mLs in the mouth or throat 2 (two) times daily., Starting Mon 05/11/2017, Print    penicillin v potassium (VEETID) 500 MG tablet Take 1 tablet (500 mg total) by mouth 4 (four) times daily., Starting Mon 05/11/2017, Until Mon 05/18/2017, Print      I personally performed the services described in this documentation, which was scribed in my presence. The recorded information has been reviewed and is accurate.     Jeanie Sewer, PA-C 05/11/17 1336    Raeford Razor, MD 05/12/17 0930

## 2017-05-11 NOTE — Discharge Instructions (Signed)
Please take all of your antibiotics until finished!   You may develop abdominal discomfort or diarrhea from the antibiotic.  You may help offset this with probiotics which you can buy or get in yogurt. Do not eat  or take the probiotics until 2 hours after your antibiotic. Apply warm compresses to jaw throughout the day. Take 600 mg of ibuprofen with food and alternate with Tylenol every 3 hours. Do not exceed 4000 mg of Tylenol daily. Stop taking ibuprofen if you have any upset stomach issues. Followup with a dentist is very important for ongoing evaluation and management of recurrent dental pain and jaw pain. Return to emergency department for emergent changing or worsening symptoms.

## 2017-05-11 NOTE — ED Triage Notes (Signed)
Pt. stated I've had lock jaw or jaw pain for the last month.  I have some soreness of the bottom of my teeth in front.

## 2017-07-15 ENCOUNTER — Encounter (HOSPITAL_COMMUNITY): Payer: Self-pay

## 2017-07-15 ENCOUNTER — Emergency Department (HOSPITAL_COMMUNITY)
Admission: EM | Admit: 2017-07-15 | Discharge: 2017-07-15 | Disposition: A | Discharge status: Home / Self Care | Payer: Self-pay | Attending: Emergency Medicine | Admitting: Emergency Medicine

## 2017-07-15 ENCOUNTER — Emergency Department (HOSPITAL_COMMUNITY): Payer: Self-pay

## 2017-07-15 DIAGNOSIS — Z791 Long term (current) use of non-steroidal anti-inflammatories (NSAID): Secondary | ICD-10-CM | POA: Insufficient documentation

## 2017-07-15 DIAGNOSIS — B349 Viral infection, unspecified: Secondary | ICD-10-CM | POA: Insufficient documentation

## 2017-07-15 DIAGNOSIS — R059 Cough, unspecified: Secondary | ICD-10-CM

## 2017-07-15 DIAGNOSIS — Z87891 Personal history of nicotine dependence: Secondary | ICD-10-CM | POA: Insufficient documentation

## 2017-07-15 DIAGNOSIS — R05 Cough: Secondary | ICD-10-CM

## 2017-07-15 DIAGNOSIS — Z79899 Other long term (current) drug therapy: Secondary | ICD-10-CM | POA: Insufficient documentation

## 2017-07-15 NOTE — ED Notes (Signed)
Pt c/o nasal congestion, chest congestion, dry cough x 2 weeks. States she works in [employer] and needs note for work.  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

## 2017-07-15 NOTE — ED Provider Notes (Signed)
[facility]-EM[facility]Y DEPT Provider Note   CSN: [id number] Arrival date & time: 07/15/17  0915     History   Chief Complaint Chief Complaint  Patient presents with  . Generalized Body Aches  . Cough    HPI Anna [name] is a 31 y.o. female.  Anna [name] is a 31 y.o. Female with no history of reactive airway disease, who presents with persistent dry cough for 2 weeks. Patient reports URI syndrome for one week with sore throat, runny nose, cough. Patient reports all symptoms resolved after a week, but the dry cough still persists. Patient report her chest is sore from coughing. Patient denies fever, chills, shortness of breath, or wheezing. She has been using cough drops during the day and Nyquil at night and reports the Nyquil has helped and she has been able to sleep through the night.         Past Medical History:  Diagnosis Date  . No pertinent past medical history   . Pregnant     There are no active problems to display for this patient.   Past Surgical History:  Procedure Laterality Date  . abortions    . DILATION AND CURETTAGE OF UTERUS      OB History    Gravida Para Term Preterm AB Living   SAB TAB Ectopic Multiple Live Births   Home Medications    Prior to Admission medications   Medication Sig Start Date End Date Taking? Authorizing Provider  amoxicillin (AMOXIL) 500 MG capsule Take 1 capsule (500 mg total) by mouth 3 (three) times daily. Patient not taking: Reported on 01/01/2015 12/30/13   Reuben Likes, MD  chlorhexidine (PERIDEX) 0.12 % solution Use as directed 15 mLs in the mouth or throat 2 (two) times daily. 05/11/17   Fawze, Mina A, PA-C  ibuprofen (ADVIL,MOTRIN) 200 MG tablet Take 400-800 mg by mouth every 8 (eight) hours as needed for fever or moderate pain.     [provider]  ibuprofen (ADVIL,MOTRIN) 600 MG tablet Take 1 tablet (600 mg total) by mouth every 6 (six) hours. Patient not taking: Reported  on 01/01/2015 10/08/12   Huel Cote, MD  oxyCODONE-acetaminophen (PERCOCET/ROXICET) 5-325 MG per tablet Take 1-2 tablets by mouth every 3 (three) hours as needed (moderate - severe pain). Patient not taking: Reported on 01/01/2015 10/08/12   Huel Cote, MD  potassium chloride SA (K-DUR,KLOR-CON) 20 MEQ tablet Take 1 tablet (20 mEq total) by mouth daily. 10/20/15   Blane Ohara, MD  predniSONE (DELTASONE) 20 MG tablet Take 1 tablet (20 mg total) by mouth 2 (two) times daily. Patient not taking: Reported on 01/01/2015 12/30/13   Reuben Likes, MD    Family History Family History  Problem Relation Age of Onset  . Kidney disease Maternal Aunt   . Diabetes Maternal Aunt   . COPD Maternal Aunt   . Diabetes Maternal Grandmother   . Cancer Maternal Grandmother   . COPD Maternal Grandmother   . Kidney disease Maternal Grandfather   . Cancer Maternal Grandfather   . COPD Maternal Grandfather   . Diabetes Cousin     Social History Social History  Substance Use Topics  . Smoking status: Former Smoker    Packs/day: 0.25    Quit date: 05/27/2012  . Smokeless tobacco: Never Used  . Alcohol use No     Allergies  Patient has no known allergies.   Review of Systems Review of Systems  Constitutional: Negative for chills and fever.  HENT: Negative for congestion, ear pain, sinus pain, sore throat and trouble swallowing.   Respiratory: Positive for cough. Negative for chest tightness, shortness of breath and wheezing.   Gastrointestinal: Negative for abdominal pain, nausea and vomiting.  Musculoskeletal: Positive for myalgias.  Skin: Negative for rash.     Physical Exam Updated Vital Signs BP 115/77 (BP Location: Right Arm)   Pulse 86   Temp 98.4 F (36.9 C) (Oral)   Resp 20   Ht  (1.626 m)   Wt 83.9 kg (185 lb)   LMP 06/19/2017   SpO2 100%   BMI 31.76 kg/m   Physical Exam  Constitutional: She appears well-developed and well-nourished. No distress.  HENT:    Head: Normocephalic and atraumatic.  Mouth/Throat: Oropharynx is clear and moist.  TMs clear, nasal mucosa is not edematous, no rhinorrhea, posterior pharynx in clear with no edema, erythema or exudates  Eyes: Right eye exhibits no discharge. Left eye exhibits no discharge.  Neck: Neck supple.  Cardiovascular: Normal rate, regular rhythm, normal heart sounds and intact distal pulses.   Pulmonary/Chest: Effort normal and breath sounds normal. No respiratory distress. She has no wheezes. She has no rales.  Lymphadenopathy:    She has no cervical adenopathy.  Neurological: She is alert. Coordination normal.  Skin: Skin is warm and dry. Capillary refill takes less than 2 seconds. She is not diaphoretic.  Psychiatric: She has a normal mood and affect. Her behavior is normal.  Nursing note and vitals reviewed.    ED Treatments / Results  Labs (all labs ordered are listed, but only abnormal results are displayed) Labs Reviewed - No data to display  EKG  EKG Interpretation None       Radiology Dg Chest 2 View  Result Date: 07/15/2017 CLINICAL DATA:  Cough and congestion.  Body aches for 2 weeks. EXAM: CHEST  2 VIEW COMPARISON:  Two-view chest x-ray 01/01/2015 FINDINGS: The heart size and mediastinal contours are within normal limits. Both lungs are clear. The visualized skeletal structures are unremarkable. IMPRESSION: No active cardiopulmonary disease. Electronically Signed   By: Marin Roberts M.D.   On: 07/15/2017 09:47    Procedures Procedures (including critical care time)  Medications Ordered in ED Medications - No data to display   Initial Impression / Assessment and Plan / ED Course  I have reviewed the triage vital signs and the nursing notes.  Pertinent labs & imaging results that were available during my care of the patient were reviewed by me and considered in my medical decision making (see chart for details).  Patient with persistent dry cough after viral  syndrome. Vitals normal, no evidence of respiratory distress. Patient denies shortness of breath, no wheezing or rales on lung exam. Discussed with patient that cough after Viral URI can persist for several weeks. Not concerned for pneumonia, not history of asthma. Recommend non-drowsy cough syrup during the day since Nyquil seems to be helping at night. Will discharge home with continued symptomatic treatment of cough. Patient to follow up with PCP if cough does not improve. Return precautions provided. Patient expresses understanding and is in agreement with plan.   Final Clinical Impressions(s) / ED Diagnoses   Final diagnoses:  Cough  Viral syndrome    New Prescriptions Discharge Medication List as of 07/15/2017 10:55 AM       Dartha Lodge, PA-C  07/15/17 2104    Gwyneth SproutPlunkett, Whitney, MD 07/16/17 2132

## 2017-07-15 NOTE — Discharge Instructions (Signed)
It is very common for a cough to linger on after a viral illness, your workup shows no pneumonia and your lungs sound clear. Use Delsym 12hr cough syrup during to day to relieve cough and continue to use cough drops and Nyquil. Return to the ED if you develop shortness of breath, chest pain, fever, chills or other concerning symptoms. Follow up with your Primary dotor.

## 2017-07-15 NOTE — ED Triage Notes (Signed)
Per Pt, Pt is coming from home with complaints of dry cough with some congestion and body aches x 2 weeks. Reports taking Nyquil at night, but is not able to get relief in the day. Some pain with coughing in throat and chest.

## 2017-12-29 ENCOUNTER — Emergency Department (HOSPITAL_COMMUNITY): Payer: Self-pay

## 2017-12-29 ENCOUNTER — Other Ambulatory Visit: Payer: Self-pay

## 2017-12-29 ENCOUNTER — Emergency Department (HOSPITAL_COMMUNITY)
Admission: EM | Admit: 2017-12-29 | Discharge: 2017-12-29 | Disposition: A | Discharge status: Home / Self Care | Payer: Self-pay | Attending: Physician Assistant | Admitting: Physician Assistant

## 2017-12-29 ENCOUNTER — Encounter (HOSPITAL_COMMUNITY): Payer: Self-pay

## 2017-12-29 DIAGNOSIS — J111 Influenza due to unidentified influenza virus with other respiratory manifestations: Secondary | ICD-10-CM | POA: Insufficient documentation

## 2017-12-29 DIAGNOSIS — Z87891 Personal history of nicotine dependence: Secondary | ICD-10-CM | POA: Insufficient documentation

## 2017-12-29 DIAGNOSIS — J181 Lobar pneumonia, unspecified organism: Secondary | ICD-10-CM | POA: Insufficient documentation

## 2017-12-29 DIAGNOSIS — J189 Pneumonia, unspecified organism: Secondary | ICD-10-CM

## 2017-12-29 MED ORDER — IBUPROFEN 200 MG PO TABS
600.0000 mg | ORAL_TABLET | Freq: Once | ORAL | Status: AC
Start: 2017-12-29 — End: 2017-12-29
  Administered 2017-12-29: 600 mg via ORAL
  Filled 2017-12-29: qty 1

## 2017-12-29 MED ORDER — DOXYCYCLINE HYCLATE 100 MG PO CAPS: 100.0000 mg | ORAL_CAPSULE | Freq: Two times a day (BID) | ORAL | 0 refills | Status: AC

## 2017-12-29 MED ORDER — BENZONATATE 100 MG PO CAPS: 100.0000 mg | ORAL_CAPSULE | Freq: Three times a day (TID) | ORAL | 0 refills | Status: AC

## 2017-12-29 MED ORDER — AMOXICILLIN-POT CLAVULANATE 875-125 MG PO TABS: 1.0000 | ORAL_TABLET | Freq: Two times a day (BID) | ORAL | 0 refills | Status: DC

## 2017-12-29 MED ORDER — ONDANSETRON 4 MG PO TBDP
4.0000 mg | ORAL_TABLET | Freq: Once | ORAL | Status: AC
  Administered 2017-12-29: 4 mg via ORAL
  Filled 2017-12-29: qty 1

## 2017-12-29 MED ORDER — ONDANSETRON 4 MG PO TBDP: ORAL_TABLET | ORAL | 0 refills | Status: AC

## 2017-12-29 NOTE — ED Triage Notes (Signed)
Pt reports cough, body aches, sore throat, nausea, chills x 1 week. Pt states she has been coughing up yellow sputum with some blood in it.

## 2017-12-29 NOTE — ED Notes (Signed)
See provider assessment 

## 2017-12-29 NOTE — Discharge Instructions (Signed)
Your chest x-ray shows evidence of a pneumonia lower lobe, this likely developed in the setting of flu.  These take Augmentin and doxycycline twice a day for the next 7 days. You may use Zofran as needed for nausea. Please make sure you are drinking plenty of fluids. You can treat your flu symptoms supportively with tylenol/ibuprofen for fevers and pains, Zyrtec and Flonase to help with nasal congestion, and tessalon perles and throat lozenges to help with cough. If your symptoms are not improving please follow up with you Primary doctor.   If you are having persistent fevers, chest pain, shortness of breath, nausea vomiting and or unable to keep down fluids or antibiotics or other new or concerning symptoms please return to the ED for reevaluation.

## 2017-12-29 NOTE — ED Notes (Signed)
Pt has had Zofran before, wants to leave at this time, instructed to watch for any reactions to medications. Pt stable and ambulatory

## 2017-12-29 NOTE — ED Notes (Signed)
Patient ambulating with pulse ox

## 2017-12-29 NOTE — ED Provider Notes (Signed)
[facility] EMERGENCY DEPARTMENT Provider Note   CSN: [id number] Arrival date & time: 12/29/17  1137     History   Chief Complaint Chief Complaint  Patient presents with  . Cough  . Generalized Body Aches    HPI Anna [name] is a 32 y.o. female.  Anna [name] is a 32 y.o. Female who is otherwise healthy, presents to the ED for evaluation of 1 week of body aches, subjective fevers and chills, cough, nasal congestion, sore throat.  She reports symptoms started 1 week ago and have been worsening.  Patient reports she initially felt like she was getting better but today got much worse, particularly with severe productive cough.   Patient reports some chest soreness with coughing, no chest pain at baseline or shortness of breath.  Patient reports mild nausea no episodes of vomiting no abdominal pain or diarrhea.  Patient reports she tried Tylenol and other over-the-counter medications to treat her symptoms that seem to initially helping but are no longer providing any relief.      Past Medical History:  Diagnosis Date  . No pertinent past medical history   . Pregnant     There are no active problems to display for this patient.   Past Surgical History:  Procedure Laterality Date  . abortions    . DILATION AND CURETTAGE OF UTERUS      OB History    Gravida Para Term Preterm AB Living   6 2 2   4 2    SAB TAB Ectopic Multiple Live Births   1 3     2        Home Medications    Prior to Admission medications   Medication Sig Start Date End Date Taking? Authorizing Provider  amoxicillin (AMOXIL) 500 MG capsule Take 1 capsule (500 mg total) by mouth 3 (three) times daily. Patient not taking: Reported on 01/01/2015 12/30/13   Reuben Likes, MD  chlorhexidine (PERIDEX) 0.12 % solution Use as directed 15 mLs in the mouth or throat 2 (two) times daily. 05/11/17   Fawze, Mina A, PA-C  ibuprofen (ADVIL,MOTRIN) 200 MG tablet Take 400-800 mg by mouth every 8  (eight) hours as needed for fever or moderate pain.     [provider]  ibuprofen (ADVIL,MOTRIN) 600 MG tablet Take 1 tablet (600 mg total) by mouth every 6 (six) hours. Patient not taking: Reported on 01/01/2015 10/08/12   Huel Cote, MD  oxyCODONE-acetaminophen (PERCOCET/ROXICET) 5-325 MG per tablet Take 1-2 tablets by mouth every 3 (three) hours as needed (moderate - severe pain). Patient not taking: Reported on 01/01/2015 10/08/12   Huel Cote, MD  potassium chloride SA (K-DUR,KLOR-CON) 20 MEQ tablet Take 1 tablet (20 mEq total) by mouth daily. 10/20/15   Blane Ohara, MD  predniSONE (DELTASONE) 20 MG tablet Take 1 tablet (20 mg total) by mouth 2 (two) times daily. Patient not taking: Reported on 01/01/2015 12/30/13   Reuben Likes, MD    Family History Family History  Problem Relation Age of Onset  . Kidney disease Maternal Aunt   . Diabetes Maternal Aunt   . COPD Maternal Aunt   . Diabetes Maternal Grandmother   . Cancer Maternal Grandmother   . COPD Maternal Grandmother   . Kidney disease Maternal Grandfather   . Cancer Maternal Grandfather   . COPD Maternal Grandfather   . Diabetes Cousin     Social History Social History   Tobacco Use  . Smoking status: Former Smoker  Packs/day: 0.25    Last attempt to quit: 05/27/2012    Years since quitting: 5.5  . Smokeless tobacco: Never Used  Substance Use Topics  . Alcohol use: No  . Drug use: No    Comment: stopped smoking for pregnancy      Allergies   Patient has no known allergies.   Review of Systems Review of Systems  Constitutional: Positive for chills and fever.  HENT: Positive for congestion, postnasal drip, rhinorrhea and sore throat. Negative for drooling and trouble swallowing.   Eyes: Negative for discharge, redness and itching.  Respiratory: Positive for cough. Negative for chest tightness, shortness of breath and wheezing.   Cardiovascular: Negative for chest pain, palpitations  and leg swelling.  Gastrointestinal: Negative for abdominal pain, nausea and vomiting.  Genitourinary: Negative for dysuria and frequency.  Musculoskeletal: Positive for arthralgias and myalgias. Negative for joint swelling and neck stiffness.  Skin: Negative for color change and rash.  Neurological: Positive for headaches. Negative for dizziness, weakness and light-headedness.     Physical Exam Updated Vital Signs BP 113/73 (BP Location: Left Arm)   Pulse 93   Temp 99.5 F (37.5 C) (Oral)   Resp 16   LMP 12/22/2017   SpO2 100%   Physical Exam  Constitutional: She appears well-developed and well-nourished. No distress.  Non-toxic appearing  HENT:  Head: Normocephalic and atraumatic.  TMs clear with good landmarks, moderate nasal mucosa edema with clear rhinorrhea, posterior oropharynx clear and moist, with some erythema, mild edema, no exudates   Eyes: Right eye exhibits no discharge. Left eye exhibits no discharge.  Neck: Neck supple.  No rigidity  Cardiovascular: Normal rate, regular rhythm and normal heart sounds.  Pulmonary/Chest: Effort normal. No stridor. No respiratory distress. She has no wheezes. She has rales.  Respirations equal and unlabored, patient able to speak in full sentences, few crackles heard in the right lower lobe, all other lung fields clear to auscultation with good air movement  Abdominal: Soft. Bowel sounds are normal. She exhibits no distension and no mass. There is no tenderness. There is no guarding.  Musculoskeletal: She exhibits no edema or deformity.  Lymphadenopathy:    She has no cervical adenopathy.  Neurological: She is alert. Coordination normal.  Skin: Skin is warm and dry. She is not diaphoretic.  Psychiatric: She has a normal mood and affect. Her behavior is normal.  Nursing note and vitals reviewed.    ED Treatments / Results  Labs (all labs ordered are listed, but only abnormal results are displayed) Labs Reviewed - No data to  display  EKG  EKG Interpretation None       Radiology Dg Chest 2 View  Result Date: 12/29/2017 CLINICAL DATA:  32 year old female with history of cough, body aches, sore throat, nausea, chills and yellow sputum production for 1 week. EXAM: CHEST  2 VIEW COMPARISON:  Chest x-ray 07/15/2017. FINDINGS: Area of airspace consolidation in the posterior aspect of the right lower lobe, concerning for pneumonia. Left lung is clear. No pleural effusions. No evidence of pulmonary edema. Heart size is normal. Upper mediastinal contours are within normal limits. IMPRESSION: Right lower lobe pneumonia. Followup PA and lateral chest X-ray is recommended in 3-4 weeks following trial of antibiotic therapy to ensure resolution and exclude underlying malignancy. Electronically Signed   By: Trudie Reed M.D.   On: 12/29/2017 15:45    Procedures Procedures (including critical care time)  Medications Ordered in ED Medications  ibuprofen (ADVIL,MOTRIN) tablet 600 mg (600  mg Oral Given 12/29/17 1720)  ondansetron (ZOFRAN-ODT) disintegrating tablet 4 mg (4 mg Oral Given 12/29/17 1720)     Initial Impression / Assessment and Plan / ED Course  I have reviewed the triage vital signs and the nursing notes.  Pertinent labs & imaging results that were available during my care of the patient were reviewed by me and considered in my medical decision making (see chart for details).  Patient presents the ED for evaluation of flulike symptoms for the past week which worsened acutely today.  Primary symptom is productive cough.  On initial evaluation vitals are normal and patient is overall well-appearing.  Mild crackles in the right lower lobe on lung exam, right lower lobe pneumonia confirmed on chest x-ray.  This is likely in the setting of influenza.  Patient maintained normal pulse ox with ambulation here in the ED I feel she is stable for outpatient treatment of her pneumonia with Augmentin and doxycycline, and  patient has had flulike symptoms week now, no added benefit for Tamiflu, rest symptomatic treatment, plenty of fluids.  Patient is tolerating p.o. fluids here in the ED exhibiting no signs of dehydration.  Will discharge with antibiotics, Zofran for nausea and Tessalon Perles for cough.  At discharge patient was noted to be febrile to 101.8 and tachycardic at 108, this is expected given patient's pneumonia and flu but has not had any antipyretics here in the ED will treat with ibuprofen prior to discharge.  Strict return precautions discussed with the patient.  She is to follow-up with primary care or return to the ED if condition worsens.  Final Clinical Impressions(s) / ED Diagnoses   Final diagnoses:  Community acquired pneumonia of right lower lobe of lung (HCC)  Influenza    ED Discharge Orders        Ordered    doxycycline (VIBRAMYCIN) 100 MG capsule  2 times daily     12/29/17 1654    amoxicillin-clavulanate (AUGMENTIN) 875-125 MG tablet  2 times daily     12/29/17 1654    benzonatate (TESSALON) 100 MG capsule  Every 8 hours     12/29/17 1654    ondansetron (ZOFRAN ODT) 4 MG disintegrating tablet     12/29/17 1654       Dartha LodgeFord, Akacia Boltz N, [place]PA-C 12/29/17 1904    Abelino DerrickMackuen, Courteney Lyn, MD 01/01/18 1956

## 2017-12-29 NOTE — ED Notes (Addendum)
While ambulating pt, pt's O2 was between 96%-100%. Pt stated that she felt "a little short of breath". Pt's pulse was elevated at 109-110. Informed Kelsey - PA.

## 2017-12-29 NOTE — ED Notes (Signed)
Adelina MingsKelsey - PA informed of pt's temp.

## 2017-12-29 NOTE — ED Notes (Signed)
Pt in xray

## 2019-11-15 ENCOUNTER — Emergency Department (HOSPITAL_COMMUNITY)
Admission: EM | Admit: 2019-11-15 | Discharge: 2019-11-15 | Disposition: A | Discharge status: Home / Self Care | Payer: Self-pay | Attending: Emergency Medicine | Admitting: Emergency Medicine

## 2019-11-15 ENCOUNTER — Encounter (HOSPITAL_COMMUNITY): Payer: Self-pay | Admitting: Emergency Medicine

## 2019-11-15 ENCOUNTER — Emergency Department (HOSPITAL_COMMUNITY): Payer: Self-pay

## 2019-11-15 DIAGNOSIS — F1210 Cannabis abuse, uncomplicated: Secondary | ICD-10-CM | POA: Insufficient documentation

## 2019-11-15 DIAGNOSIS — Z87891 Personal history of nicotine dependence: Secondary | ICD-10-CM | POA: Insufficient documentation

## 2019-11-15 DIAGNOSIS — M25571 Pain in right ankle and joints of right foot: Secondary | ICD-10-CM | POA: Insufficient documentation

## 2019-11-15 DIAGNOSIS — M79671 Pain in right foot: Secondary | ICD-10-CM | POA: Insufficient documentation

## 2019-11-15 MED ORDER — NAPROXEN 500 MG PO TABS: 500.0000 mg | ORAL_TABLET | Freq: Two times a day (BID) | ORAL | 0 refills | Status: AC

## 2019-11-15 MED ORDER — HYDROCODONE-ACETAMINOPHEN 5-325 MG PO TABS
1.0000 | ORAL_TABLET | Freq: Once | ORAL | Status: AC
  Administered 2019-11-15: 1 via ORAL
  Filled 2019-11-15: qty 1

## 2019-11-15 NOTE — ED Provider Notes (Signed)
[facility] EMERGENCY DEPARTMENT Provider Note   CSN: [id number] Arrival date & time: 11/15/19  2956     History Chief Complaint  Patient presents with  . Foot Injury    Anna [name] is a 34 y.o. female with no significant past medical history who presents to the ED due to sudden onset of right ankle and foot pain x1 day.  Patient states she was walking down the stairs and slipped down 4-5 steps breaking her fall by grasping the railing.  Patient denies head injury and loss of consciousness.  Patient is not on any blood thinners.  Patient admits to severe anterior right ankle pain that radiates into the dorsum of her right foot worse with movement.  Patient states she has been taking ibuprofen with moderate relief.  Patient admits to numbness and tingling around her ankle.  Patient denies erythema, edema, and warmth.  Patient denies previous injury to right ankle and right foot.  Patient denies other injuries.    Past Medical History:  Diagnosis Date  . No pertinent past medical history   . Pregnant     There are no problems to display for this patient.   Past Surgical History:  Procedure Laterality Date  . abortions    . DILATION AND CURETTAGE OF UTERUS       OB History    Gravida  6   Para  2   Term  2   Preterm      AB  4   Living  2     SAB  1   TAB  3   Ectopic      Multiple      Live Births  2           Family History  Problem Relation Age of Onset  . Kidney disease Maternal Aunt   . Diabetes Maternal Aunt   . COPD Maternal Aunt   . Diabetes Maternal Grandmother   . Cancer Maternal Grandmother   . COPD Maternal Grandmother   . Kidney disease Maternal Grandfather   . Cancer Maternal Grandfather   . COPD Maternal Grandfather   . Diabetes Cousin     Social History   Tobacco Use  . Smoking status: Former Smoker    Packs/day: 0.25    Quit date: 05/27/2012    Years since quitting: 7.4  . Smokeless tobacco: Never  Used  Substance Use Topics  . Alcohol use: No  . Drug use: No    Types: Marijuana    Comment: stopped smoking for pregnancy     Home Medications Prior to Admission medications   Medication Sig Start Date End Date Taking? Authorizing Provider  amoxicillin (AMOXIL) 500 MG capsule Take 1 capsule (500 mg total) by mouth 3 (three) times daily. Patient not taking: Reported on 01/01/2015 12/30/13   Reuben Likes, MD  amoxicillin-clavulanate (AUGMENTIN) 875-125 MG tablet Take 1 tablet by mouth 2 (two) times daily. One po bid x 7 days 12/29/17   Dartha Lodge, PA-C  benzonatate (TESSALON) 100 MG capsule Take 1 capsule (100 mg total) by mouth every 8 (eight) hours. 12/29/17   Dartha Lodge, PA-C  chlorhexidine (PERIDEX) 0.12 % solution Use as directed 15 mLs in the mouth or throat 2 (two) times daily. 05/11/17   Luevenia Maxin, Mina A, PA-C  doxycycline (VIBRAMYCIN) 100 MG capsule Take 1 capsule (100 mg total) by mouth 2 (two) times daily. One po bid x 7 days 12/29/17   Ala Dach,  Arva Chafe, PA-C  ibuprofen (ADVIL,MOTRIN) 200 MG tablet Take 400-800 mg by mouth every 8 (eight) hours as needed for fever or moderate pain.     [provider]  ibuprofen (ADVIL,MOTRIN) 600 MG tablet Take 1 tablet (600 mg total) by mouth every 6 (six) hours. Patient not taking: Reported on 01/01/2015 10/08/12   Huel Cote, MD  naproxen (NAPROSYN) 500 MG tablet Take 1 tablet (500 mg total) by mouth 2 (two) times daily. 11/15/19   Mannie Stabile, PA-C  ondansetron (ZOFRAN ODT) 4 MG disintegrating tablet 4mg  ODT q4 hours prn nausea/vomit 12/29/17   12/31/17, PA-C  oxyCODONE-acetaminophen (PERCOCET/ROXICET) 5-325 MG per tablet Take 1-2 tablets by mouth every 3 (three) hours as needed (moderate - severe pain). Patient not taking: Reported on 01/01/2015 10/08/12   14/6/13, MD  potassium chloride SA (K-DUR,KLOR-CON) 20 MEQ tablet Take 1 tablet (20 mEq total) by mouth daily. 10/20/15   10/22/15, MD  predniSONE  (DELTASONE) 20 MG tablet Take 1 tablet (20 mg total) by mouth 2 (two) times daily. Patient not taking: Reported on 01/01/2015 12/30/13   01/01/14, MD    Allergies    Patient has no known allergies.  Review of Systems   Review of Systems  Constitutional: Negative for chills and fever.  Musculoskeletal: Positive for arthralgias and gait problem.  Skin: Negative for color change and wound.  Neurological: Positive for numbness.    Physical Exam Updated Vital Signs BP 110/78 (BP Location: Right Arm)   Pulse 75   Temp 98.1 F (36.7 C) (Oral)   Resp 15   LMP 11/09/2019 (Approximate)   SpO2 100%   Physical Exam Vitals and nursing note reviewed.  Constitutional:      General: She is not in acute distress.    Appearance: She is not ill-appearing.  HENT:     Head: Normocephalic.  Eyes:     Conjunctiva/sclera: Conjunctivae normal.  Cardiovascular:     Rate and Rhythm: Normal rate and regular rhythm.     Pulses: Normal pulses.     Heart sounds: Normal heart sounds. No murmur. No friction rub. No gallop.   Pulmonary:     Effort: Pulmonary effort is normal.     Breath sounds: Normal breath sounds.  Abdominal:     General: Abdomen is flat. There is no distension.     Palpations: Abdomen is soft.     Tenderness: There is no abdominal tenderness. There is no guarding or rebound.  Musculoskeletal:     Cervical back: Neck supple.     Comments: Tenderness to palpation on anterior aspect of right ankle into dorsum of right foot. Tenderness over toes 2-4. No erythema, edema, or warmth. Neurovascularly intact. Full ROM of ankle. Limited ROM of toes 2-4 due to pain. Soft compartments. Normal knee with no tenderness and full ROM  Skin:    General: Skin is warm and dry.  Neurological:     General: No focal deficit present.     Mental Status: She is alert.     ED Results / Procedures / Treatments   Labs (all labs ordered are listed, but only abnormal results are displayed) Labs  Reviewed - No data to display  EKG None  Radiology DG Ankle Complete Right  Result Date: 11/15/2019 CLINICAL DATA:  Pain following EXAM: RIGHT ANKLE - COMPLETE 3+ VIEW COMPARISON:  None. FINDINGS: Frontal, oblique, and lateral views were obtained. There is no evident fracture or joint effusion. There is  soft tissue swelling anteriorly. No appreciable joint space narrowing or erosion. Ankle mortise appears intact. IMPRESSION: Soft tissue swelling anteriorly. No evident fracture. No appreciable arthropathy. Ankle mortise appears intact. Electronically Signed   By: Lowella Grip III M.D.   On: 11/15/2019 08:13   DG Foot Complete Right  Result Date: 11/15/2019 CLINICAL DATA:  Pain following fall EXAM: RIGHT FOOT COMPLETE - 3+ VIEW COMPARISON:  None. FINDINGS: Frontal, oblique, and lateral views obtained. There is no demonstrable fracture or dislocation. Joint spaces appear normal. No erosive change. IMPRESSION: No fracture or dislocation.  No appreciable arthropathy. Electronically Signed   By: Lowella Grip III M.D.   On: 11/15/2019 08:12    Procedures Procedures (including critical care time)  Medications Ordered in ED Medications  HYDROcodone-acetaminophen (NORCO/VICODIN) 5-325 MG per tablet 1 tablet (1 tablet Oral Given 11/15/19 0931)    ED Course  I have reviewed the triage vital signs and the nursing notes.  Pertinent labs & imaging results that were available during my care of the patient were reviewed by me and considered in my medical decision making (see chart for details).    MDM Rules/Calculators/A&P                     34 year old female presents to the ED due to sudden onset of right ankle and foot pain after falling down a few stairs yesterday. Denies head injury and loss of consciousness. Patient in not on any blood thinners.  Patient denies previous injury to right ankle and right foot.  Stable vitals.  Patient in no acute distress and non-ill-appearing.  Tenderness  palpation over the anterior aspect of her right ankle down into the dorsum of foot. No erythema, edema, or warmth. Full ROM of right ankle. Limited ROM of toes 2-4 due to pain. Neurovascularly intact. Soft compartments. Normal right knee with no tenderness and full ROM. X-rays personally reviewed which are negative for bony fractures. Suspect ankle sprain. Ankle and foot ace wrapped. Crutches given at discharge. Will treat symptomatically with naproxen. RICE discussed with patient. Patient advised to follow-up with PCP if symptoms do not improve within the next week. Strict ED precautions discussed with patient. Patient states understanding and agrees to plan. Patient discharged home in no acute distress and stable vitals  Final Clinical Impression(s) / ED Diagnoses Final diagnoses:  Acute right ankle pain  Right foot pain    Rx / DC Orders ED Discharge Orders         Ordered    naproxen (NAPROSYN) 500 MG tablet  2 times daily     11/15/19 0918           Suzy Bouchard, PA-C 11/15/19 1001    Lennice Sites, DO 11/15/19 1206

## 2019-11-15 NOTE — Discharge Instructions (Addendum)
As discussed, your x-rays were negative for any broken bones. I am sending you home with a pain medication called naproxen that you can take twice a day. Do not mix with other medications except Tylenol. Continue to elevate and ice foot as needed. I have included the number of [facility]. I recommend calling to make an appointment to establish care. Return to the ER for new or worsening symptoms.

## 2019-11-15 NOTE — ED Triage Notes (Signed)
Pt states yesterday she fell down 5 steps yesterday and injured her right ankle and foot. Pt states she has pain around her ankle into her right toes.

## 2020-11-19 ENCOUNTER — Encounter (HOSPITAL_COMMUNITY): Payer: Self-pay | Admitting: *Deleted

## 2020-11-19 ENCOUNTER — Other Ambulatory Visit: Payer: Self-pay

## 2020-11-19 ENCOUNTER — Emergency Department (HOSPITAL_COMMUNITY): Admission: EM | Admit: 2020-11-19 | Discharge: 2020-11-19 | Discharge status: Against Medical Advice | Payer: Self-pay

## 2020-11-19 NOTE — ED Triage Notes (Signed)
Pt reports recently diagnosed with covid, works in a nursing home. Having symptoms x 3-4 days. Woke up with am with increase in cough and headache. No distress is noted at triage.

## 2020-11-19 NOTE — ED Notes (Signed)
Unable to locate pt in lobby to obtain vitals.

## 2020-11-20 ENCOUNTER — Emergency Department (HOSPITAL_COMMUNITY): Admission: EM | Admit: 2020-11-20 | Discharge: 2020-11-21 | Discharge status: Against Medical Advice | Payer: Self-pay

## 2020-11-20 ENCOUNTER — Other Ambulatory Visit: Payer: Self-pay

## 2021-05-23 IMAGING — DX DG ANKLE COMPLETE 3+V*R*
3 series · 3 of 3 positions shown · non-contrast
Comparison: None.

CLINICAL DATA: Pain following

EXAM:
RIGHT ANKLE - COMPLETE 3+ VIEW

[ankle ap]
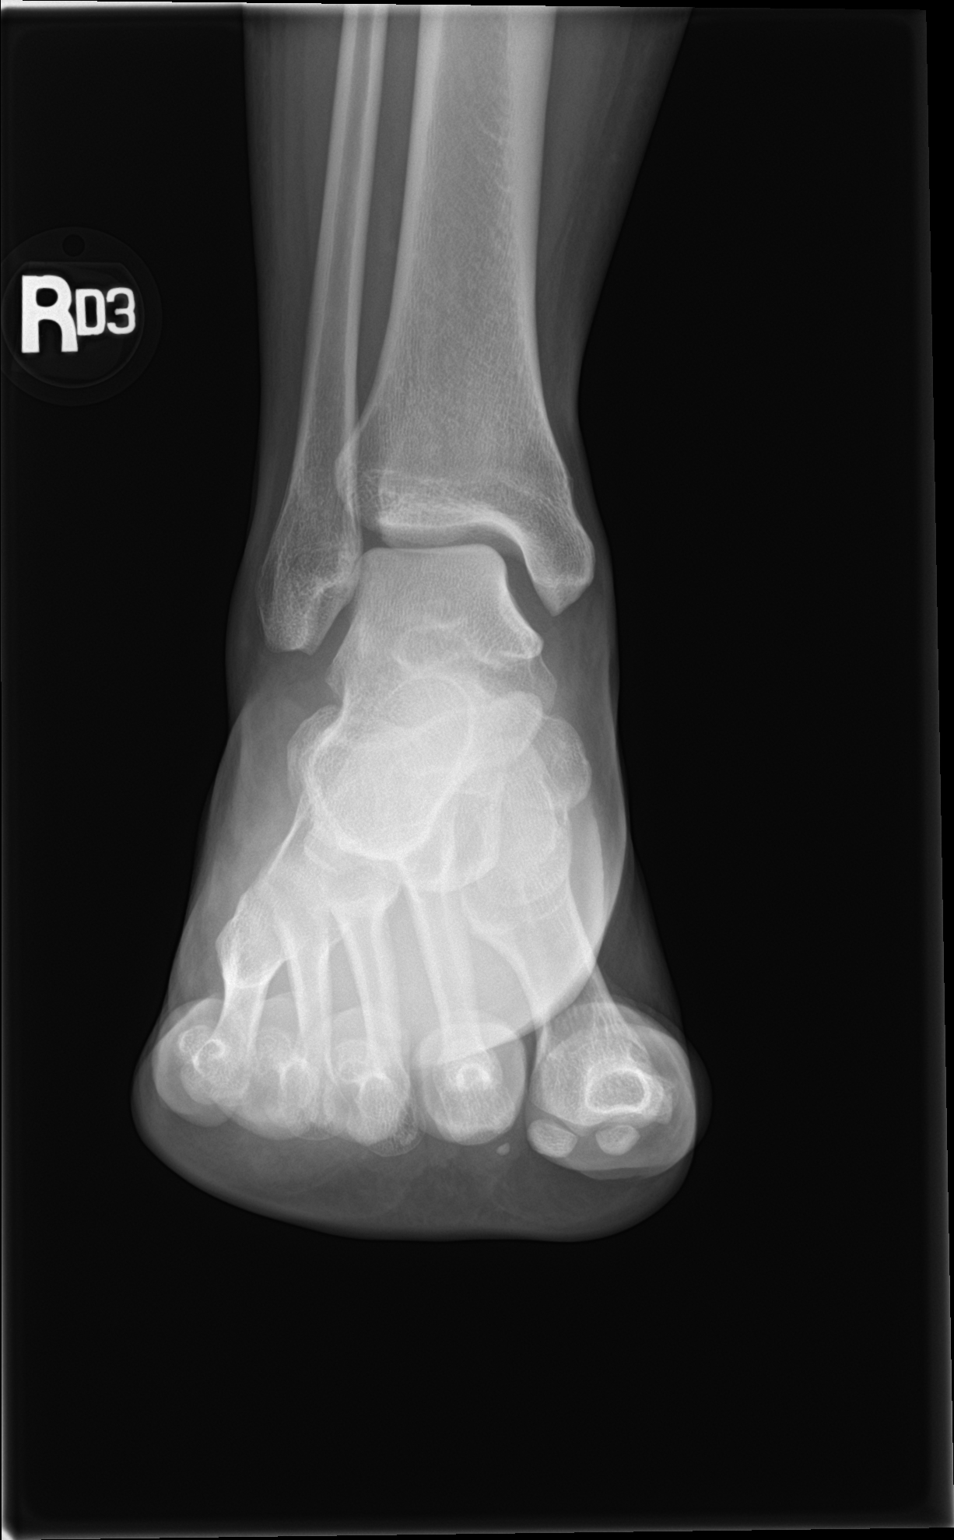

[ankle obl]
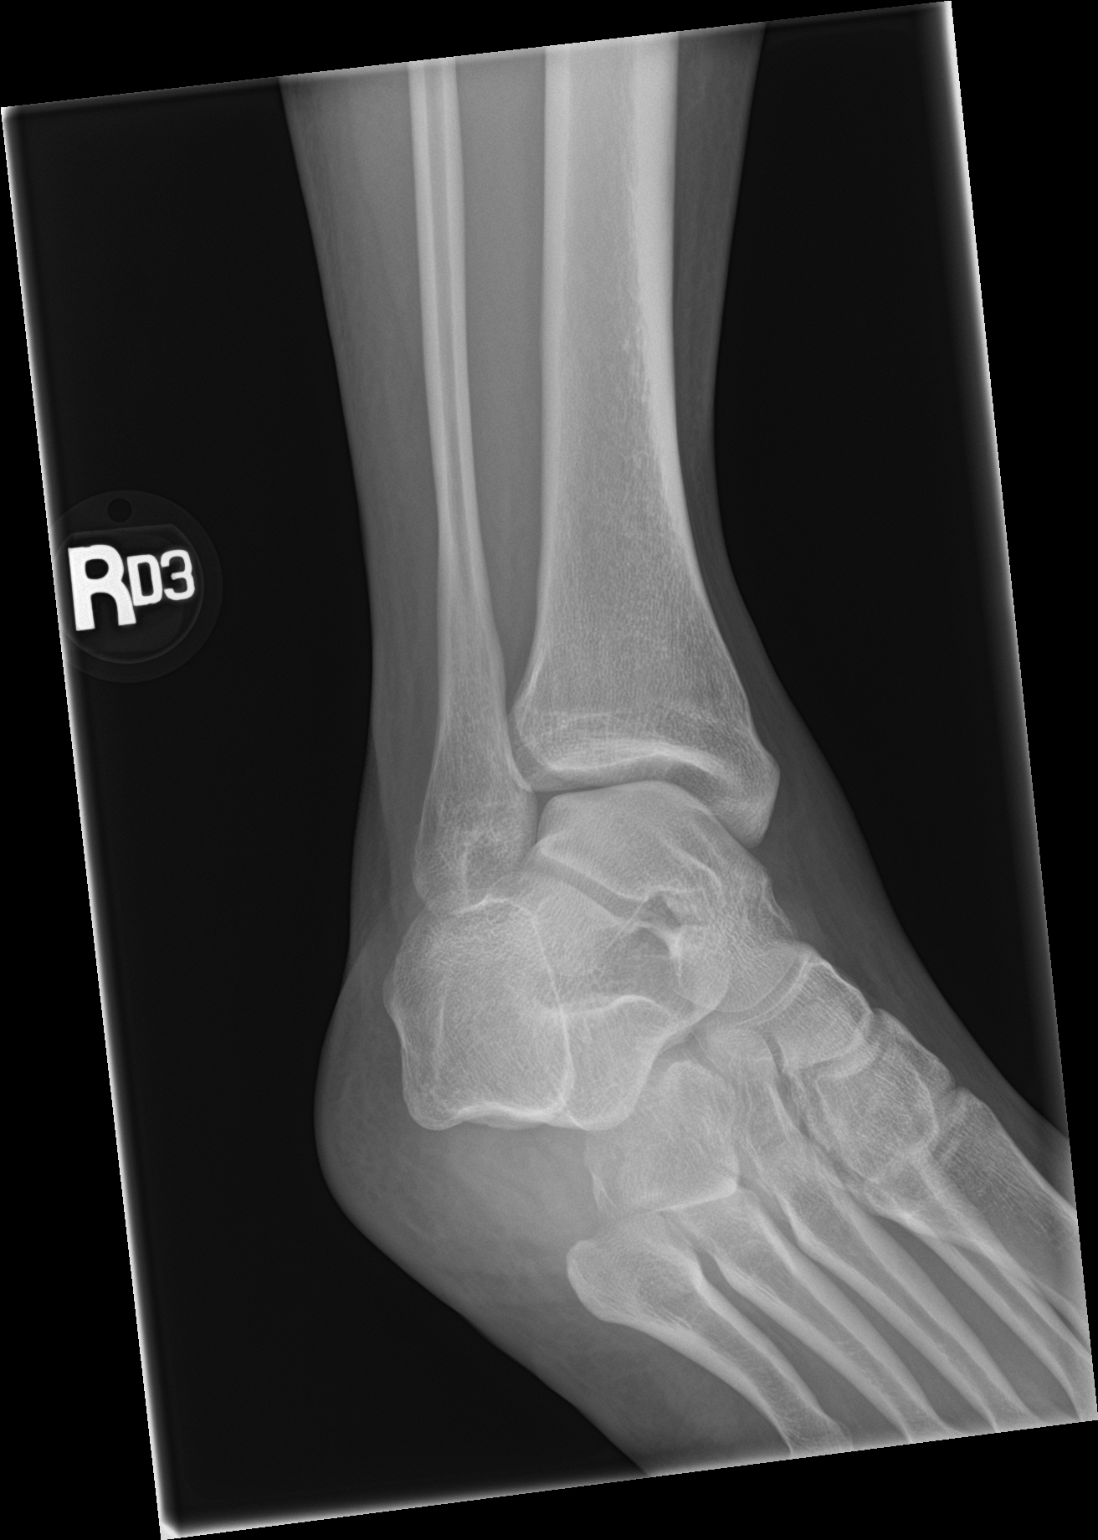

[ankle lat]
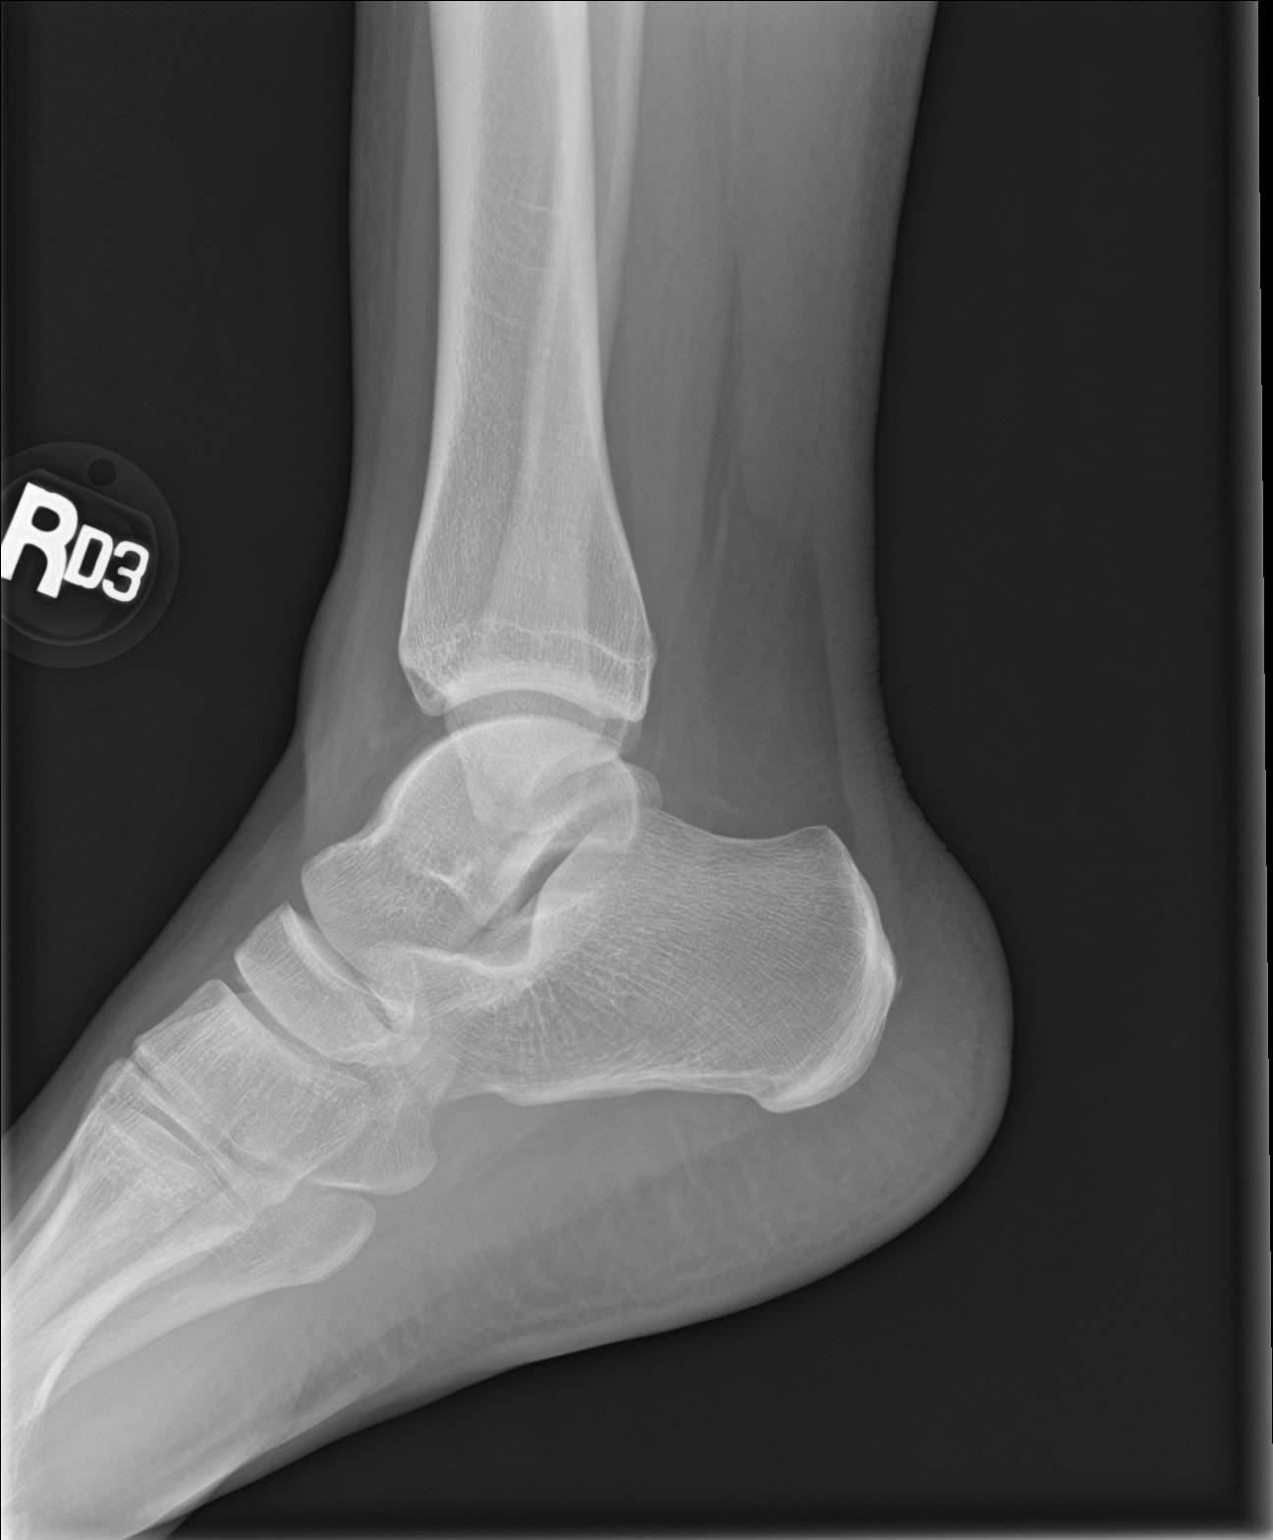

[3 of 3 positions shown; findings below may reference images not displayed]

FINDINGS: Frontal, oblique, and lateral views were obtained. There is no
evident fracture or joint effusion. There is soft tissue swelling
anteriorly. No appreciable joint space narrowing or erosion. Ankle
mortise appears intact.
IMPRESSION: Soft tissue swelling anteriorly. No evident fracture. No appreciable
arthropathy. Ankle mortise appears intact.

## 2024-10-16 ENCOUNTER — Emergency Department (HOSPITAL_COMMUNITY): Admission: EM | Admit: 2024-10-16 | Discharge: 2024-10-16 | Disposition: A | Discharge status: Home / Self Care

## 2024-10-16 ENCOUNTER — Encounter (HOSPITAL_COMMUNITY): Payer: Self-pay

## 2024-10-16 ENCOUNTER — Other Ambulatory Visit: Payer: Self-pay

## 2024-10-16 DIAGNOSIS — Z79899 Other long term (current) drug therapy: Secondary | ICD-10-CM | POA: Diagnosis not present

## 2024-10-16 DIAGNOSIS — K0889 Other specified disorders of teeth and supporting structures: Secondary | ICD-10-CM

## 2024-10-16 DIAGNOSIS — K047 Periapical abscess without sinus: Secondary | ICD-10-CM | POA: Insufficient documentation

## 2024-10-16 DIAGNOSIS — D509 Iron deficiency anemia, unspecified: Secondary | ICD-10-CM | POA: Diagnosis not present

## 2024-10-16 LAB — CBC WITH DIFFERENTIAL/PLATELET
Abs Immature Granulocytes: 0.02 K/uL (ref 0.00–0.07)
Basophils Absolute: 0.1 K/uL (ref 0.0–0.1)
Basophils Relative: 1 %
Eosinophils Absolute: 0.1 K/uL (ref 0.0–0.5)
Eosinophils Relative: 2 %
HCT: 30.9 % — ABNORMAL LOW (ref 36.0–46.0)
Hemoglobin: 9.3 g/dL — ABNORMAL LOW (ref 12.0–15.0)
Immature Granulocytes: 0 %
Lymphocytes Relative: 31 %
Lymphs Abs: 2 K/uL (ref 0.7–4.0)
MCH: 22.2 pg — ABNORMAL LOW (ref 26.0–34.0)
MCHC: 30.1 g/dL (ref 30.0–36.0)
MCV: 73.9 fL — ABNORMAL LOW (ref 80.0–100.0)
Monocytes Absolute: 0.4 K/uL (ref 0.1–1.0)
Monocytes Relative: 6 %
Neutro Abs: 4 K/uL (ref 1.7–7.7)
Neutrophils Relative %: 60 %
Platelets: 327 K/uL (ref 150–400)
RBC: 4.18 MIL/uL (ref 3.87–5.11)
RDW: 17.6 % — ABNORMAL HIGH (ref 11.5–15.5)
WBC: 6.6 K/uL (ref 4.0–10.5)
nRBC: 0 % (ref 0.0–0.2)

## 2024-10-16 LAB — BASIC METABOLIC PANEL WITH GFR
Anion gap: 10 (ref 5–15)
BUN: 7 mg/dL (ref 6–20)
CO2: 19 mmol/L — ABNORMAL LOW (ref 22–32)
Calcium: 8 mg/dL — ABNORMAL LOW (ref 8.9–10.3)
Chloride: 107 mmol/L (ref 98–111)
Creatinine, Ser: 0.87 mg/dL (ref 0.44–1.00)
GFR, Estimated: >60 mL/min (ref >60)
Glucose, Bld: 95 mg/dL (ref 70–99)
Potassium: 3.7 mmol/L (ref 3.5–5.1)
Sodium: 136 mmol/L (ref 135–145)

## 2024-10-16 MED ORDER — FERROUS SULFATE 325 (65 FE) MG PO TABS: 325.0000 mg | ORAL_TABLET | Freq: Every day | ORAL | 0 refills | Status: AC

## 2024-10-16 MED ORDER — AMOXICILLIN-POT CLAVULANATE 875-125 MG PO TABS: 1.0000 | ORAL_TABLET | Freq: Two times a day (BID) | ORAL | 0 refills | Status: AC

## 2024-10-16 NOTE — ED Triage Notes (Signed)
 C/O abscess in mouth for a month.  Denies fevers. Axox4.

## 2024-10-16 NOTE — ED Provider Notes (Addendum)
 [place] [facility]ERGENC[facility] DEPARTMENT AT [facility] Provider Note   CSN: [id number] Arrival date & time: 10/16/24  1124     Patient presents with: Abscess   Anna [name] is a 38 y.o. female.    Abscess Associated symptoms: no fever and no vomiting       Patient presents with concern for dental infection.  Patient states that he has been present on and off for the past month or so.  Endorses what she feels like is a little bumps along the base of her teeth that become infected.  Patient says she has multiple dental caries.  Has not called any kind of dentist yet to establish care.  Pain with chewing.  Is been taking ibuprofen .  No swelling in her neck and no difficulty tolerating p.o. in terms of swallowing.  No shortness of breath.      Prior to Admission medications  Medication Sig Start Date End Date Taking? Authorizing Provider  amoxicillin -clavulanate (AUGMENTIN ) 875-125 MG tablet Take 1 tablet by mouth every 12 (twelve) hours for 10 days. 10/16/24 10/26/24 Yes Simon Lavonia SAILOR, MD  ferrous sulfate  325 (65 FE) MG tablet Take 1 tablet (325 mg total) by mouth daily. 10/16/24  Yes Simon Lavonia SAILOR, MD  amoxicillin  (AMOXIL ) 500 MG capsule Take 1 capsule (500 mg total) by mouth 3 (three) times daily. Patient not taking: Reported on 01/01/2015 12/30/13   Sonda Alm BROCKS, MD  benzonatate  (TESSALON ) 100 MG capsule Take 1 capsule (100 mg total) by mouth every 8 (eight) hours. 12/29/17   Alva Larraine FALCON, PA-C  chlorhexidine  (PERIDEX ) 0.12 % solution Use as directed 15 mLs in the mouth or throat 2 (two) times daily. 05/11/17   Fawze, Mina A, PA-C  doxycycline  (VIBRAMYCIN ) 100 MG capsule Take 1 capsule (100 mg total) by mouth 2 (two) times daily. One po bid x 7 days 12/29/17   Alva Larraine FALCON, PA-C  ibuprofen  (ADVIL ,MOTRIN ) 200 MG tablet Take 400-800 mg by mouth every 8 (eight) hours as needed for fever or moderate pain.     [provider]  ibuprofen  (ADVIL ,MOTRIN ) 600 MG  tablet Take 1 tablet (600 mg total) by mouth every 6 (six) hours. Patient not taking: Reported on 01/01/2015 10/08/12   Estelle Service, MD  naproxen  (NAPROSYN ) 500 MG tablet Take 1 tablet (500 mg total) by mouth 2 (two) times daily. 11/15/19   Aberman, Caroline C, PA-C  ondansetron  (ZOFRAN  ODT) 4 MG disintegrating tablet 4mg  ODT q4 hours prn nausea/vomit 12/29/17   Kehrli, Kelsey F, PA-C  oxyCODONE -acetaminophen  (PERCOCET/ROXICET) 5-325 MG per tablet Take 1-2 tablets by mouth every 3 (three) hours as needed (moderate - severe pain). Patient not taking: Reported on 01/01/2015 10/08/12   Estelle Service, MD  potassium chloride  SA (K-DUR,KLOR-CON ) 20 MEQ tablet Take 1 tablet (20 mEq total) by mouth daily. 10/20/15   Tonia Chew, MD  predniSONE  (DELTASONE ) 20 MG tablet Take 1 tablet (20 mg total) by mouth 2 (two) times daily. Patient not taking: Reported on 01/01/2015 12/30/13   Sonda Alm BROCKS, MD    Allergies: Patient has no known allergies.    Review of Systems  Constitutional:  Negative for chills and fever.  HENT:  Negative for ear pain and sore throat.   Eyes:  Negative for pain and visual disturbance.  Respiratory:  Negative for cough and shortness of breath.   Cardiovascular:  Negative for chest pain and palpitations.  Gastrointestinal:  Negative for abdominal pain and vomiting.  Genitourinary:  Negative for  dysuria and hematuria.  Musculoskeletal:  Negative for arthralgias and back pain.  Skin:  Negative for color change and rash.  Neurological:  Negative for seizures and syncope.  All other systems reviewed and are negative.   Updated Vital Signs BP 121/83   Pulse 81   Temp 98.8 F (37.1 C)   Resp 14   Ht 5' 4 (1.626 m)   Wt 86.2 kg   LMP 08/26/2024 (Approximate)   SpO2 100%   BMI 32.61 kg/m   Physical Exam Vitals and nursing note reviewed.  Constitutional:      General: She is not in acute distress.    Appearance: She is well-developed.  HENT:     Head:  Normocephalic and atraumatic.     Mouth/Throat:     Comments: Multiple dental carries noted  Eyes:     Conjunctiva/sclera: Conjunctivae normal.  Cardiovascular:     Rate and Rhythm: Normal rate and regular rhythm.     Heart sounds: No murmur heard. Pulmonary:     Effort: Pulmonary effort is normal. No respiratory distress.     Breath sounds: Normal breath sounds.  Abdominal:     Palpations: Abdomen is soft.     Tenderness: There is no abdominal tenderness.  Musculoskeletal:        General: No swelling.     Cervical back: Neck supple.  Skin:    General: Skin is warm and dry.     Capillary Refill: Capillary refill takes less than 2 seconds.  Neurological:     Mental Status: She is alert.  Psychiatric:        Mood and Affect: Mood normal.     (all labs ordered are listed, but only abnormal results are displayed) Labs Reviewed  CBC WITH DIFFERENTIAL/PLATELET - Abnormal; Notable for the following components:      Result Value   Hemoglobin 9.3 (*)    HCT 30.9 (*)    MCV 73.9 (*)    MCH 22.2 (*)    RDW 17.6 (*)    All other components within normal limits  BASIC METABOLIC PANEL WITH GFR - Abnormal; Notable for the following components:   CO2 19 (*)    Calcium 8.0 (*)    All other components within normal limits    EKG: None  Radiology: No results found.   Procedures   Medications Ordered in the ED - No data to display                                  Medical Decision Making Risk OTC drugs. Prescription drug management.     HPI:  Patient presents with concern for dental infection.  Patient states that he has been present on and off for the past month or so.  Endorses what she feels like is a little bumps along the base of her teeth that become infected.  Patient says she has multiple dental caries.  Has not called any kind of dentist yet to establish care.  Pain with chewing.  Is been taking ibuprofen .  No swelling in her neck and no difficulty tolerating  p.o. in terms of swallowing.  No shortness of breath.   MDM:   Upon reexamination, patient hemodynamically stable. A&O x 3 with GCS 15.  No evidence of abscess on exam.  Looks like she has multiple dental caries with possible gingivitis.  No Ludwig.  No concerns for deep space infection this  time.  No indication for imaging.  There is nothing to drain at this time.  Labs were obtained by triage.  Will wait for labs to result but plan on discharging the patient with Augmentin .  Denies allergies to antibiotics.  Reevaluation:   Upon reexamination, patient hemodynamically stable.  Remains A&O x 3 with GCS 15.  Patient found to be anemic as well.  Looks like chronic anemia.  Microcytic.  Likely iron deficiency.  Currently not taking iron.  Will prescribe her iron to have her follow with PCP for repeat laboratory workup.   Started patient on Augmentin  as well in the setting of dental infection  Interventions: augmentin , Iron tablets    Social Determinant of Health: no iv drug abuse    Disposition and Follow Up: dentistry       Final diagnoses:  Dental abscess  Pain, dental  Iron deficiency anemia, unspecified iron deficiency anemia type    ED Discharge Orders          Ordered    amoxicillin -clavulanate (AUGMENTIN ) 875-125 MG tablet  Every 12 hours        10/16/24 1217    ferrous sulfate  325 (65 FE) MG tablet  Daily        10/16/24 1225               Simon Lavonia SAILOR, MD 10/16/24 1226    Simon Lavonia SAILOR, MD 10/16/24 1227

## 2024-10-16 NOTE — ED Provider Triage Note (Signed)
 Emergency Medicine Provider Triage Evaluation Note  Maya Arcand , a 38 y.o. female  was evaluated in triage.  Pt complains of gum pain.  Patient reports she has had multiple abscesses in her mouth on and off for the last 1 month.  Patient states she does not currently have a dentist.  Patient states that she does feel like the abscesses are draining, but the pain has gotten so severe to the point where she feels like she is unable to eat.  She does report body aches, but denies fever and chills.  Review of Systems  Positive: Gum pain, body aches Negative: Fevers  Physical Exam  BP 121/83   Pulse 81   Temp 98.8 F (37.1 C)   Resp 14   SpO2 100%  Gen:   Awake, no distress   Resp:  Normal effort  MSK:   Moves extremities without difficulty  Other:  Patient with multiple caries noted.  There is some swelling on the right lower side of the mouth.  No obvious abscess noted.  Medical Decision Making  Medically screening exam initiated at 11:29 AM.  Appropriate orders placed.  Aylyn Wenzler was informed that the remainder of the evaluation will be completed by another provider, this initial triage assessment does not replace that evaluation, and the importance of remaining in the ED until their evaluation is complete.  Basic labs ordered.   Torrence Marry RAMAN, PA-C 10/16/24 1135

## 2024-10-16 NOTE — ED Triage Notes (Signed)
 Pt c/o pain on gums, states she has abscesses on gumsxmo. Pt states it's painful when have to talk.

## 2024-10-16 NOTE — Discharge Instructions (Addendum)
 For pain, you can take 1000 mg of Tylenol  or 1 g of Tylenol  every 6-8 hours.  Do not exceed more than 4000 mg or 4 g in a 24-hour period.  You can also take ibuprofen  600 to 800 mg every 6-8 hours as well.  Do not take this high-dose ibuprofen  for greater than a week.  You do have anemia.  Your hemoglobin is baseline low.  Likely because of microcytic anemia.  Please follow-up with PCP.  You will need iron tablets.  Did call in iron tablets for you to take in the meantime.   Take the Augmentin .  You have multiple dental caries.  These infections will continue to happen unless you see a dentist for further care.  You have any,swelling or difficulty swallowing or tolerating food or water then come back to the ED immediately.
